# Patient Record
Sex: Female | Born: 1955 | Race: White | Hispanic: No | Marital: Married | Smoking: Never smoker
Health system: Southern US, Community
[De-identification: ages and names within clinical notes are randomized; demographics above are authoritative.]

## PROBLEM LIST (undated history)

## (undated) DIAGNOSIS — T8859XA Other complications of anesthesia, initial encounter: Secondary | ICD-10-CM

## (undated) DIAGNOSIS — R519 Headache, unspecified: Secondary | ICD-10-CM

## (undated) DIAGNOSIS — J45909 Unspecified asthma, uncomplicated: Secondary | ICD-10-CM

## (undated) DIAGNOSIS — D649 Anemia, unspecified: Secondary | ICD-10-CM

## (undated) DIAGNOSIS — K635 Polyp of colon: Secondary | ICD-10-CM

## (undated) DIAGNOSIS — M169 Osteoarthritis of hip, unspecified: Secondary | ICD-10-CM

## (undated) DIAGNOSIS — K219 Gastro-esophageal reflux disease without esophagitis: Secondary | ICD-10-CM

## (undated) DIAGNOSIS — F32A Depression, unspecified: Secondary | ICD-10-CM

## (undated) DIAGNOSIS — M1990 Unspecified osteoarthritis, unspecified site: Secondary | ICD-10-CM

## (undated) DIAGNOSIS — E669 Obesity, unspecified: Secondary | ICD-10-CM

## (undated) HISTORY — PX: ESOPHAGOGASTRODUODENOSCOPY: SHX1529

## (undated) HISTORY — PX: COLONOSCOPY: SHX174

---

## 2008-11-20 HISTORY — PX: OTHER SURGICAL HISTORY: SHX169

## 2017-11-20 HISTORY — PX: LAPAROSCOPIC REPAIR AND REMOVAL OF GASTRIC BAND: SHX5919

## 2021-11-23 ENCOUNTER — Other Ambulatory Visit: Payer: Self-pay | Admitting: Sports Medicine

## 2021-11-23 DIAGNOSIS — M5137 Other intervertebral disc degeneration, lumbosacral region: Secondary | ICD-10-CM

## 2021-11-23 DIAGNOSIS — M79605 Pain in left leg: Secondary | ICD-10-CM

## 2021-11-23 DIAGNOSIS — M47816 Spondylosis without myelopathy or radiculopathy, lumbar region: Secondary | ICD-10-CM

## 2021-11-23 DIAGNOSIS — M79604 Pain in right leg: Secondary | ICD-10-CM

## 2021-11-23 DIAGNOSIS — M4316 Spondylolisthesis, lumbar region: Secondary | ICD-10-CM

## 2021-12-07 ENCOUNTER — Other Ambulatory Visit: Payer: Self-pay

## 2021-12-07 ENCOUNTER — Ambulatory Visit
Admission: RE | Admit: 2021-12-07 | Discharge: 2021-12-07 | Disposition: A | Discharge status: Home / Self Care | Payer: BC Managed Care – PPO | Source: Ambulatory Visit | Attending: Sports Medicine | Admitting: Sports Medicine

## 2021-12-07 DIAGNOSIS — M4316 Spondylolisthesis, lumbar region: Secondary | ICD-10-CM | POA: Insufficient documentation

## 2021-12-07 DIAGNOSIS — M79605 Pain in left leg: Secondary | ICD-10-CM | POA: Insufficient documentation

## 2021-12-07 DIAGNOSIS — M79604 Pain in right leg: Secondary | ICD-10-CM | POA: Diagnosis present

## 2021-12-07 DIAGNOSIS — M5137 Other intervertebral disc degeneration, lumbosacral region: Secondary | ICD-10-CM | POA: Insufficient documentation

## 2021-12-07 DIAGNOSIS — M47816 Spondylosis without myelopathy or radiculopathy, lumbar region: Secondary | ICD-10-CM | POA: Insufficient documentation

## 2022-01-30 ENCOUNTER — Other Ambulatory Visit: Payer: Self-pay | Admitting: Orthopedic Surgery

## 2022-02-09 ENCOUNTER — Other Ambulatory Visit: Payer: Self-pay

## 2022-02-09 ENCOUNTER — Encounter
Admission: RE | Admit: 2022-02-09 | Discharge: 2022-02-09 | Disposition: A | Discharge status: Home / Self Care | Payer: BC Managed Care – PPO | Source: Ambulatory Visit | Attending: Orthopedic Surgery | Admitting: Orthopedic Surgery

## 2022-02-09 VITALS — BP 124/72 | HR 67 | Resp 16 | Ht 65.0 in | Wt 203.0 lb

## 2022-02-09 DIAGNOSIS — Z01818 Encounter for other preprocedural examination: Secondary | ICD-10-CM | POA: Insufficient documentation

## 2022-02-09 HISTORY — DX: Depression, unspecified: F32.A

## 2022-02-09 HISTORY — DX: Unspecified asthma, uncomplicated: J45.909

## 2022-02-09 HISTORY — DX: Gastro-esophageal reflux disease without esophagitis: K21.9

## 2022-02-09 HISTORY — DX: Unspecified osteoarthritis, unspecified site: M19.90

## 2022-02-09 HISTORY — DX: Headache, unspecified: R51.9

## 2022-02-09 HISTORY — DX: Obesity, unspecified: E66.9

## 2022-02-09 HISTORY — DX: Polyp of colon: K63.5

## 2022-02-09 HISTORY — DX: Other complications of anesthesia, initial encounter: T88.59XA

## 2022-02-09 HISTORY — DX: Anemia, unspecified: D64.9

## 2022-02-09 LAB — CBC WITH DIFFERENTIAL/PLATELET
Abs Immature Granulocytes: 0.01 10*3/uL (ref 0.00–0.07)
Basophils Absolute: 0.1 10*3/uL (ref 0.0–0.1)
Basophils Relative: 1 %
Eosinophils Absolute: 0.5 10*3/uL (ref 0.0–0.5)
Eosinophils Relative: 9 %
HCT: 39.6 % (ref 36.0–46.0)
Hemoglobin: 12.1 g/dL (ref 12.0–15.0)
Immature Granulocytes: 0 %
Lymphocytes Relative: 35 %
Lymphs Abs: 2 10*3/uL (ref 0.7–4.0)
MCH: 25.1 pg — ABNORMAL LOW (ref 26.0–34.0)
MCHC: 30.6 g/dL (ref 30.0–36.0)
MCV: 82.2 fL (ref 80.0–100.0)
Monocytes Absolute: 0.4 10*3/uL (ref 0.1–1.0)
Monocytes Relative: 6 %
Neutro Abs: 2.7 10*3/uL (ref 1.7–7.7)
Neutrophils Relative %: 49 %
Platelets: 235 10*3/uL (ref 150–400)
RBC: 4.82 MIL/uL (ref 3.87–5.11)
RDW: 19.7 % — ABNORMAL HIGH (ref 11.5–15.5)
WBC: 5.6 10*3/uL (ref 4.0–10.5)
nRBC: 0 % (ref 0.0–0.2)

## 2022-02-09 LAB — COMPREHENSIVE METABOLIC PANEL
ALT: 14 U/L (ref 0–44)
AST: 19 U/L (ref 15–41)
Albumin: 4 g/dL (ref 3.5–5.0)
Alkaline Phosphatase: 66 U/L (ref 38–126)
Anion gap: 9 (ref 5–15)
BUN: 25 mg/dL — ABNORMAL HIGH (ref 8–23)
CO2: 28 mmol/L (ref 22–32)
Calcium: 9.3 mg/dL (ref 8.9–10.3)
Chloride: 105 mmol/L (ref 98–111)
Creatinine, Ser: 0.86 mg/dL (ref 0.44–1.00)
GFR, Estimated: >60 mL/min (ref >60)
Glucose, Bld: 83 mg/dL (ref 70–99)
Potassium: 4.5 mmol/L (ref 3.5–5.1)
Sodium: 142 mmol/L (ref 135–145)
Total Bilirubin: 0.6 mg/dL (ref 0.3–1.2)
Total Protein: 7.4 g/dL (ref 6.5–8.1)

## 2022-02-09 LAB — URINALYSIS, ROUTINE W REFLEX MICROSCOPIC
Bilirubin Urine: NEGATIVE
Glucose, UA: NEGATIVE mg/dL
Hgb urine dipstick: NEGATIVE
Ketones, ur: NEGATIVE mg/dL
Leukocytes,Ua: NEGATIVE
Nitrite: NEGATIVE
Protein, ur: NEGATIVE mg/dL
Specific Gravity, Urine: 1.026 (ref 1.005–1.030)
pH: 5 (ref 5.0–8.0)

## 2022-02-09 LAB — TYPE AND SCREEN
ABO/RH(D): O NEG
Antibody Screen: NEGATIVE

## 2022-02-09 LAB — SURGICAL PCR SCREEN
MRSA, PCR: NEGATIVE
Staphylococcus aureus: NEGATIVE

## 2022-02-09 NOTE — Patient Instructions (Addendum)
Your procedure is scheduled on:02-21-22 Tuesday ?Report to the Registration Desk on the 1st floor of the [facility].Then proceed to the 2nd floor Surgery Desk in the [facility] ?To find out your arrival time, please call [phone] between 1PM - 3PM on:02-20-22 Monday ? ?REMEMBER: ?Instructions that are not followed completely may result in serious medical risk, up to and including death; or upon the discretion of your surgeon and anesthesiologist your surgery may need to be rescheduled. ? ?Do not eat food after midnight the night before surgery.  ?No gum chewing, lozengers or hard candies. ? ?You may however, drink CLEAR liquids up to 2 hours before you are scheduled to arrive for your surgery. Do not drink anything within 2 hours of your scheduled arrival time. ? ?Clear liquids include: ?- water  ?- apple juice without pulp ?- gatorade (not RED colors) ?- black coffee or tea (Do NOT add milk or creamers to the coffee or tea) ?Do NOT drink anything that is not on this list. ? ?In addition, your doctor has ordered for you to drink the provided  ?Ensure Pre-Surgery Clear Carbohydrate Drink  ?Drinking this carbohydrate drink up to two hours before surgery helps to reduce insulin resistance and improve patient outcomes. Please complete drinking 2 hours prior to scheduled arrival time. ? ?TAKE THESE MEDICATIONS THE MORNING OF SURGERY WITH A SIP OF WATER: ?-DULoxetine (CYMBALTA)  ?-cetirizine (ZYRTEC) ?-gabapentin (NEURONTIN) ?-omeprazole (PRILOSEC) -take one the night before and one on the morning of surgery - helps to prevent nausea after surgery.) ? ?Use your albuterol (VENTOLIN HFA) Inhaler the day of surgery and bring inhaler to the hospital ? ?One week prior to surgery:Last dose on 02-14-22 Tuesday ?Stop Anti-inflammatories (NSAIDS) such as Advil, Aleve, Ibuprofen, Motrin, Naproxen, Naprosyn and Aspirin based products such as Excedrin, Goodys Powder, BC Powder.You may however, take Tylenol if needed for pain up  until the day of surgery. ? ?Stop ANY OVER THE COUNTER supplements/vitamins 7 days prior to surgery (VITAMIN D3 , MAGNESIUM,VITAMIN B-12 ) ? ?No Alcohol for 24 hours before or after surgery. ? ?No Smoking including e-cigarettes for 24 hours prior to surgery.  ?No chewable tobacco products for at least 6 hours prior to surgery.  ?No nicotine patches on the day of surgery. ? ?Do not use any "recreational" drugs for at least a week prior to your surgery.  ?Please be advised that the combination of cocaine and anesthesia may have negative outcomes, up to and including death. ?If you test positive for cocaine, your surgery will be cancelled. ? ?On the morning of surgery brush your teeth with toothpaste and water, you may rinse your mouth with mouthwash if you wish. ?Do not swallow any toothpaste or mouthwash. ? ?Use CHG Soap as directed on instruction sheet. ? ?Do not wear jewelry, make-up, hairpins, clips or nail polish. ? ?Do not wear lotions, powders, or perfumes.  ? ?Do not shave body from the neck down 48 hours prior to surgery just in case you cut yourself which could leave a site for infection.  ?Also, freshly shaved skin may become irritated if using the CHG soap. ? ?Contact lenses, hearing aids and dentures may not be worn into surgery. ? ?Do not bring valuables to the hospital. [facility] is not responsible for any missing/lost belongings or valuables.  ? ?Notify your doctor if there is any change in your medical condition (cold, fever, infection). ? ?Wear comfortable clothing (specific to your surgery type) to the hospital. ? ?After surgery,  you can help prevent lung complications by doing breathing exercises.  ?Take deep breaths and cough every 1-2 hours. Your doctor may order a device called an Incentive Spirometer to help you take deep breaths. ?When coughing or sneezing, hold a pillow firmly against your incision with both hands. This is called ?splinting.? Doing this helps protect your incision. It also  decreases belly discomfort. ? ?If you are being admitted to the hospital overnight, leave your suitcase in the car. ?After surgery it may be brought to your room. ? ?If you are being discharged the day of surgery, you will not be allowed to drive home. ?You will need a responsible adult (18 years or older) to drive you home and stay with you that night.  ? ?If you are taking public transportation, you will need to have a responsible adult (18 years or older) with you. ?Please confirm with your physician that it is acceptable to use public transportation.  ? ?Please call the [facility] Dept. at [phone] if you have any questions about these instructions. ? ?Surgery Visitation Policy: ? ?Patients undergoing a surgery or procedure may have two family members or support persons with them as long as the person is not COVID-19 positive or experiencing its symptoms.  ? ?Inpatient Visitation:   ? ?Visiting hours are 7 a.m. to 8 p.m. ?Up to four visitors are allowed at one time in a patient room, including children. The visitors may rotate out with other people during the day. One designated support person (adult) may remain overnight. ? ?All Areas: ?All visitors must pass COVID-19 screenings, use hand sanitizer when entering and exiting the patient?s room and wear a mask at all times, including in the patient?s room. ?Patients must also wear a mask when staff or their visitor are in the room. ?Masking is required regardless of vaccination status.  ?

## 2022-02-17 ENCOUNTER — Other Ambulatory Visit: Payer: BC Managed Care – PPO

## 2022-02-21 ENCOUNTER — Observation Stay
Admission: RE | Admit: 2022-02-21 | Discharge: 2022-02-22 | Disposition: A | Discharge status: Home Health | Payer: BC Managed Care – PPO | Attending: Orthopedic Surgery | Admitting: Orthopedic Surgery

## 2022-02-21 ENCOUNTER — Encounter: Payer: Self-pay | Admitting: Orthopedic Surgery

## 2022-02-21 ENCOUNTER — Other Ambulatory Visit: Payer: Self-pay

## 2022-02-21 ENCOUNTER — Ambulatory Visit: Payer: BC Managed Care – PPO

## 2022-02-21 ENCOUNTER — Encounter
Admission: RE | Disposition: A | Discharge status: Home Health | Payer: Self-pay | Source: Home / Self Care | Attending: Orthopedic Surgery

## 2022-02-21 ENCOUNTER — Observation Stay: Payer: BC Managed Care – PPO

## 2022-02-21 ENCOUNTER — Ambulatory Visit: Payer: BC Managed Care – PPO | Admitting: Anesthesiology

## 2022-02-21 ENCOUNTER — Ambulatory Visit: Payer: BC Managed Care – PPO | Admitting: Urgent Care

## 2022-02-21 DIAGNOSIS — J45909 Unspecified asthma, uncomplicated: Secondary | ICD-10-CM | POA: Insufficient documentation

## 2022-02-21 DIAGNOSIS — Z79899 Other long term (current) drug therapy: Secondary | ICD-10-CM | POA: Insufficient documentation

## 2022-02-21 DIAGNOSIS — Z96641 Presence of right artificial hip joint: Secondary | ICD-10-CM

## 2022-02-21 DIAGNOSIS — M1611 Unilateral primary osteoarthritis, right hip: Secondary | ICD-10-CM | POA: Diagnosis not present

## 2022-02-21 HISTORY — PX: TOTAL HIP ARTHROPLASTY: SHX124

## 2022-02-21 LAB — CBC
HCT: 39.9 % (ref 36.0–46.0)
Hemoglobin: 12.6 g/dL (ref 12.0–15.0)
MCH: 26.4 pg (ref 26.0–34.0)
MCHC: 31.6 g/dL (ref 30.0–36.0)
MCV: 83.5 fL (ref 80.0–100.0)
Platelets: 219 10*3/uL (ref 150–400)
RBC: 4.78 MIL/uL (ref 3.87–5.11)
RDW: 18.8 % — ABNORMAL HIGH (ref 11.5–15.5)
WBC: 9.1 10*3/uL (ref 4.0–10.5)
nRBC: 0 % (ref 0.0–0.2)

## 2022-02-21 LAB — CREATININE, SERUM
Creatinine, Ser: 0.89 mg/dL (ref 0.44–1.00)
GFR, Estimated: >60 mL/min (ref >60)

## 2022-02-21 LAB — ABO/RH: ABO/RH(D): O NEG

## 2022-02-21 SURGERY — ARTHROPLASTY, HIP, TOTAL, ANTERIOR APPROACH
Anesthesia: General | Site: Hip | Laterality: Right

## 2022-02-21 MED ORDER — ALBUTEROL SULFATE (2.5 MG/3ML) 0.083% IN NEBU: 3.0000 mL | INHALATION_SOLUTION | Freq: Four times a day (QID) | RESPIRATORY_TRACT | Status: DC | PRN

## 2022-02-21 MED ORDER — SODIUM CHLORIDE FLUSH 0.9 % IV SOLN
INTRAVENOUS | Status: AC
  Filled 2022-02-21: qty 40

## 2022-02-21 MED ORDER — ENOXAPARIN SODIUM 40 MG/0.4ML IJ SOSY
40.0000 mg | PREFILLED_SYRINGE | INTRAMUSCULAR | Status: DC
  Administered 2022-02-22: 40 mg via SUBCUTANEOUS
  Filled 2022-02-21: qty 0.4

## 2022-02-21 MED ORDER — ORAL CARE MOUTH RINSE: 15.0000 mL | Freq: Once | OROMUCOSAL | Status: AC

## 2022-02-21 MED ORDER — MORPHINE SULFATE (PF) 2 MG/ML IV SOLN
0.5000 mg | INTRAVENOUS | Status: DC | PRN
  Administered 2022-02-21 – 2022-02-22 (×2): 1 mg via INTRAVENOUS
  Filled 2022-02-21 (×2): qty 1

## 2022-02-21 MED ORDER — OXYCODONE HCL 5 MG PO TABS: 5.0000 mg | ORAL_TABLET | Freq: Once | ORAL | Status: DC | PRN

## 2022-02-21 MED ORDER — TRANEXAMIC ACID-NACL 1000-0.7 MG/100ML-% IV SOLN
INTRAVENOUS | Status: DC | PRN
  Administered 2022-02-21: 1000 mg via INTRAVENOUS

## 2022-02-21 MED ORDER — CEFAZOLIN SODIUM-DEXTROSE 2-4 GM/100ML-% IV SOLN
2.0000 g | Freq: Four times a day (QID) | INTRAVENOUS | Status: AC
  Administered 2022-02-21 (×2): 2 g via INTRAVENOUS
  Filled 2022-02-21 (×2): qty 100

## 2022-02-21 MED ORDER — MIDAZOLAM HCL 5 MG/5ML IJ SOLN
INTRAMUSCULAR | Status: DC | PRN
Start: 2022-02-21 — End: 2022-02-21
  Administered 2022-02-21 (×2): 1 mg via INTRAVENOUS

## 2022-02-21 MED ORDER — CEFAZOLIN SODIUM-DEXTROSE 2-4 GM/100ML-% IV SOLN
2.0000 g | INTRAVENOUS | Status: AC
  Administered 2022-02-21: 2 g via INTRAVENOUS

## 2022-02-21 MED ORDER — MENTHOL 3 MG MT LOZG: 1.0000 | LOZENGE | OROMUCOSAL | Status: DC | PRN

## 2022-02-21 MED ORDER — GABAPENTIN 300 MG PO CAPS
300.0000 mg | ORAL_CAPSULE | Freq: Three times a day (TID) | ORAL | Status: DC
  Administered 2022-02-21: 300 mg via ORAL
  Filled 2022-02-21 (×2): qty 1

## 2022-02-21 MED ORDER — ACETAMINOPHEN 325 MG PO TABS: 325.0000 mg | ORAL_TABLET | Freq: Four times a day (QID) | ORAL | Status: DC | PRN

## 2022-02-21 MED ORDER — DULOXETINE HCL 30 MG PO CPEP
60.0000 mg | ORAL_CAPSULE | Freq: Every morning | ORAL | Status: DC
  Administered 2022-02-22: 60 mg via ORAL
  Filled 2022-02-21: qty 2

## 2022-02-21 MED ORDER — NEOMYCIN-POLYMYXIN B GU 40-200000 IR SOLN
Status: AC
  Filled 2022-02-21: qty 4

## 2022-02-21 MED ORDER — SODIUM CHLORIDE (PF) 0.9 % IJ SOLN
INTRAMUSCULAR | Status: DC | PRN
  Administered 2022-02-21: 90 mL via INTRA_ARTICULAR

## 2022-02-21 MED ORDER — TRAMADOL HCL 50 MG PO TABS
50.0000 mg | ORAL_TABLET | Freq: Four times a day (QID) | ORAL | Status: DC
  Administered 2022-02-21 – 2022-02-22 (×3): 50 mg via ORAL
  Filled 2022-02-21 (×3): qty 1

## 2022-02-21 MED ORDER — METOCLOPRAMIDE HCL 10 MG PO TABS: 5.0000 mg | ORAL_TABLET | Freq: Three times a day (TID) | ORAL | Status: DC | PRN

## 2022-02-21 MED ORDER — FENTANYL CITRATE (PF) 100 MCG/2ML IJ SOLN: 25.0000 ug | INTRAMUSCULAR | Status: DC | PRN

## 2022-02-21 MED ORDER — PHENOL 1.4 % MT LIQD: 1.0000 | OROMUCOSAL | Status: DC | PRN

## 2022-02-21 MED ORDER — PROPOFOL 500 MG/50ML IV EMUL
INTRAVENOUS | Status: DC | PRN
  Administered 2022-02-21: 100 ug/kg/min via INTRAVENOUS
  Administered 2022-02-21: 50 mg via INTRAVENOUS
  Administered 2022-02-21: 75 ug/kg/min via INTRAVENOUS

## 2022-02-21 MED ORDER — BUPIVACAINE LIPOSOME 1.3 % IJ SUSP
INTRAMUSCULAR | Status: AC
  Filled 2022-02-21: qty 20

## 2022-02-21 MED ORDER — GABAPENTIN 300 MG PO CAPS
300.0000 mg | ORAL_CAPSULE | Freq: Three times a day (TID) | ORAL | Status: DC
  Administered 2022-02-21: 300 mg via ORAL
  Filled 2022-02-21: qty 1

## 2022-02-21 MED ORDER — PROPOFOL 10 MG/ML IV BOLUS: INTRAVENOUS | Status: DC | PRN

## 2022-02-21 MED ORDER — NEOMYCIN-POLYMYXIN B GU 40-200000 IR SOLN
Status: AC
  Filled 2022-02-21: qty 20

## 2022-02-21 MED ORDER — DOCUSATE SODIUM 100 MG PO CAPS
100.0000 mg | ORAL_CAPSULE | Freq: Two times a day (BID) | ORAL | Status: DC
  Administered 2022-02-21 – 2022-02-22 (×2): 100 mg via ORAL
  Filled 2022-02-21 (×2): qty 1

## 2022-02-21 MED ORDER — ONDANSETRON HCL 4 MG/2ML IJ SOLN: 4.0000 mg | Freq: Four times a day (QID) | INTRAMUSCULAR | Status: DC | PRN

## 2022-02-21 MED ORDER — POLYETHYLENE GLYCOL 3350 17 G PO PACK: 17.0000 g | PACK | Freq: Every day | ORAL | Status: DC | PRN

## 2022-02-21 MED ORDER — TRAMADOL HCL 50 MG PO TABS
ORAL_TABLET | ORAL | Status: AC
  Administered 2022-02-21: 50 mg via ORAL
  Filled 2022-02-21: qty 1

## 2022-02-21 MED ORDER — PANTOPRAZOLE SODIUM 40 MG PO TBEC
40.0000 mg | DELAYED_RELEASE_TABLET | Freq: Every day | ORAL | Status: DC
  Administered 2022-02-22: 40 mg via ORAL
  Filled 2022-02-21: qty 1

## 2022-02-21 MED ORDER — PROPOFOL 10 MG/ML IV BOLUS
INTRAVENOUS | Status: AC
  Filled 2022-02-21: qty 20

## 2022-02-21 MED ORDER — ACETAMINOPHEN 10 MG/ML IV SOLN: 1000.0000 mg | Freq: Once | INTRAVENOUS | Status: DC | PRN

## 2022-02-21 MED ORDER — CEFAZOLIN SODIUM-DEXTROSE 2-4 GM/100ML-% IV SOLN
INTRAVENOUS | Status: AC
  Filled 2022-02-21: qty 100

## 2022-02-21 MED ORDER — ONDANSETRON HCL 4 MG/2ML IJ SOLN
INTRAMUSCULAR | Status: AC
  Filled 2022-02-21: qty 2

## 2022-02-21 MED ORDER — FENTANYL CITRATE (PF) 100 MCG/2ML IJ SOLN
INTRAMUSCULAR | Status: AC
  Filled 2022-02-21: qty 2

## 2022-02-21 MED ORDER — BISACODYL 5 MG PO TBEC
5.0000 mg | DELAYED_RELEASE_TABLET | Freq: Every day | ORAL | Status: DC | PRN
Start: 2022-02-21 — End: 2022-02-22

## 2022-02-21 MED ORDER — PHENYLEPHRINE HCL-NACL 20-0.9 MG/250ML-% IV SOLN
INTRAVENOUS | Status: DC | PRN
  Administered 2022-02-21: 20 ug/min via INTRAVENOUS
  Administered 2022-02-21: 40 ug/min via INTRAVENOUS

## 2022-02-21 MED ORDER — METHOCARBAMOL 1000 MG/10ML IJ SOLN
500.0000 mg | Freq: Four times a day (QID) | INTRAVENOUS | Status: DC | PRN
  Filled 2022-02-21: qty 5

## 2022-02-21 MED ORDER — ZOLPIDEM TARTRATE 5 MG PO TABS: 5.0000 mg | ORAL_TABLET | Freq: Every evening | ORAL | Status: DC | PRN

## 2022-02-21 MED ORDER — CHLORHEXIDINE GLUCONATE 0.12 % MT SOLN
OROMUCOSAL | Status: AC
  Administered 2022-02-21: 15 mL via OROMUCOSAL
  Filled 2022-02-21: qty 15

## 2022-02-21 MED ORDER — METHOCARBAMOL 500 MG PO TABS
ORAL_TABLET | ORAL | Status: AC
  Administered 2022-02-21: 500 mg via ORAL
  Filled 2022-02-21: qty 1

## 2022-02-21 MED ORDER — 0.9 % SODIUM CHLORIDE (POUR BTL) OPTIME
TOPICAL | Status: DC | PRN
Start: 2022-02-21 — End: 2022-02-21
  Administered 2022-02-21: 1000 mL

## 2022-02-21 MED ORDER — METHOCARBAMOL 500 MG PO TABS
500.0000 mg | ORAL_TABLET | Freq: Four times a day (QID) | ORAL | Status: DC | PRN
  Administered 2022-02-22: 500 mg via ORAL
  Filled 2022-02-21: qty 1

## 2022-02-21 MED ORDER — HYDROCODONE-ACETAMINOPHEN 5-325 MG PO TABS
1.0000 | ORAL_TABLET | ORAL | Status: DC | PRN
  Filled 2022-02-21: qty 1

## 2022-02-21 MED ORDER — HYDROCODONE-ACETAMINOPHEN 7.5-325 MG PO TABS
1.0000 | ORAL_TABLET | ORAL | Status: DC | PRN
  Administered 2022-02-21: 1 via ORAL
  Filled 2022-02-21: qty 1

## 2022-02-21 MED ORDER — ONDANSETRON HCL 4 MG PO TABS: 4.0000 mg | ORAL_TABLET | Freq: Four times a day (QID) | ORAL | Status: DC | PRN

## 2022-02-21 MED ORDER — MIDAZOLAM HCL 2 MG/2ML IJ SOLN
INTRAMUSCULAR | Status: AC
  Filled 2022-02-21: qty 2

## 2022-02-21 MED ORDER — MAGNESIUM HYDROXIDE 400 MG/5ML PO SUSP
30.0000 mL | Freq: Every day | ORAL | Status: DC
  Administered 2022-02-21: 30 mL via ORAL
  Filled 2022-02-21: qty 30

## 2022-02-21 MED ORDER — BUPIVACAINE-EPINEPHRINE (PF) 0.25% -1:200000 IJ SOLN
INTRAMUSCULAR | Status: AC
  Filled 2022-02-21: qty 30

## 2022-02-21 MED ORDER — FENTANYL CITRATE (PF) 100 MCG/2ML IJ SOLN
INTRAMUSCULAR | Status: DC | PRN
  Administered 2022-02-21: 50 ug via INTRAVENOUS

## 2022-02-21 MED ORDER — ONDANSETRON HCL 4 MG/2ML IJ SOLN
INTRAMUSCULAR | Status: DC | PRN
  Administered 2022-02-21: 4 mg via INTRAVENOUS

## 2022-02-21 MED ORDER — BUPIVACAINE HCL (PF) 0.5 % IJ SOLN
INTRAMUSCULAR | Status: DC | PRN
  Administered 2022-02-21: 2.3 mL

## 2022-02-21 MED ORDER — MORPHINE SULFATE (PF) 10 MG/ML IV SOLN
INTRAVENOUS | Status: AC
  Filled 2022-02-21: qty 1

## 2022-02-21 MED ORDER — LACTATED RINGERS IV SOLN: INTRAVENOUS | Status: DC

## 2022-02-21 MED ORDER — DIPHENHYDRAMINE HCL 12.5 MG/5ML PO ELIX: 12.5000 mg | ORAL_SOLUTION | ORAL | Status: DC | PRN

## 2022-02-21 MED ORDER — TRANEXAMIC ACID 1000 MG/10ML IV SOLN
INTRAVENOUS | Status: AC
  Filled 2022-02-21: qty 10

## 2022-02-21 MED ORDER — DEXAMETHASONE SODIUM PHOSPHATE 10 MG/ML IJ SOLN
INTRAMUSCULAR | Status: DC | PRN
  Administered 2022-02-21: 10 mg via INTRAVENOUS

## 2022-02-21 MED ORDER — SODIUM CHLORIDE 0.9 % IV SOLN: INTRAVENOUS | Status: DC

## 2022-02-21 MED ORDER — OXYCODONE HCL 5 MG/5ML PO SOLN: 5.0000 mg | Freq: Once | ORAL | Status: DC | PRN

## 2022-02-21 MED ORDER — METOCLOPRAMIDE HCL 5 MG/ML IJ SOLN: 5.0000 mg | Freq: Three times a day (TID) | INTRAMUSCULAR | Status: DC | PRN

## 2022-02-21 MED ORDER — FLEET ENEMA 7-19 GM/118ML RE ENEM: 1.0000 | ENEMA | Freq: Once | RECTAL | Status: DC | PRN

## 2022-02-21 MED ORDER — CHLORHEXIDINE GLUCONATE 0.12 % MT SOLN: 15.0000 mL | Freq: Once | OROMUCOSAL | Status: AC

## 2022-02-21 MED ORDER — EPHEDRINE SULFATE (PRESSORS) 50 MG/ML IJ SOLN
INTRAMUSCULAR | Status: DC | PRN
  Administered 2022-02-21: 5 mg via INTRAVENOUS

## 2022-02-21 MED ORDER — LORATADINE 10 MG PO TABS
10.0000 mg | ORAL_TABLET | Freq: Every day | ORAL | Status: DC
  Filled 2022-02-21: qty 1

## 2022-02-21 MED ORDER — ONDANSETRON HCL 4 MG/2ML IJ SOLN: 4.0000 mg | Freq: Once | INTRAMUSCULAR | Status: DC | PRN

## 2022-02-21 MED ORDER — DEXAMETHASONE SODIUM PHOSPHATE 10 MG/ML IJ SOLN
INTRAMUSCULAR | Status: AC
  Filled 2022-02-21: qty 1

## 2022-02-21 MED ORDER — ALUM & MAG HYDROXIDE-SIMETH 200-200-20 MG/5ML PO SUSP: 30.0000 mL | ORAL | Status: DC | PRN

## 2022-02-21 MED ORDER — PHENYLEPHRINE 40 MCG/ML (10ML) SYRINGE FOR IV PUSH (FOR BLOOD PRESSURE SUPPORT)
PREFILLED_SYRINGE | INTRAVENOUS | Status: DC | PRN
  Administered 2022-02-21 (×2): 80 ug via INTRAVENOUS

## 2022-02-21 MED ORDER — NEOMYCIN-POLYMYXIN B GU 40-200000 IR SOLN
Status: DC | PRN
Start: 2022-02-21 — End: 2022-02-21
  Administered 2022-02-21: 4 mL

## 2022-02-21 MED ORDER — ACETAMINOPHEN 325 MG PO TABS
ORAL_TABLET | ORAL | Status: AC
  Administered 2022-02-21: 325 mg via ORAL
  Filled 2022-02-21: qty 1

## 2022-02-21 SURGICAL SUPPLY — 55 items
BLADE SAGITTAL AGGR TOOTH XLG (BLADE) ×2 IMPLANT
BNDG COHESIVE 6X5 TAN ST LF (GAUZE/BANDAGES/DRESSINGS) ×6 IMPLANT
CANISTER WOUND CARE 500ML ATS (WOUND CARE) ×2 IMPLANT
CHLORAPREP W/TINT 26 (MISCELLANEOUS) ×2 IMPLANT
COVER BACK TABLE REUSABLE LG (DRAPES) ×2 IMPLANT
DRAPE 3/4 80X56 (DRAPES) ×6 IMPLANT
DRAPE C-ARM XRAY 36X54 (DRAPES) ×2 IMPLANT
DRAPE INCISE IOBAN 66X60 STRL (DRAPES) IMPLANT
DRAPE POUCH INSTRU U-SHP 10X18 (DRAPES) ×2 IMPLANT
DRESSING SURGICEL FIBRLLR 1X2 (HEMOSTASIS) ×2 IMPLANT
DRSG MEPILEX SACRM 8.7X9.8 (GAUZE/BANDAGES/DRESSINGS) ×2 IMPLANT
DRSG SURGICEL FIBRILLAR 1X2 (HEMOSTASIS) ×4
ELECT BLADE 6.5 EXT (BLADE) ×2 IMPLANT
ELECT REM PT RETURN 9FT ADLT (ELECTROSURGICAL) ×2
ELECTRODE REM PT RTRN 9FT ADLT (ELECTROSURGICAL) ×1 IMPLANT
GLOVE SURG SYN 9.0  PF PI (GLOVE) ×2
GLOVE SURG SYN 9.0 PF PI (GLOVE) ×2 IMPLANT
GLOVE SURG UNDER POLY LF SZ9 (GLOVE) ×2 IMPLANT
GOWN SRG 2XL LVL 4 RGLN SLV (GOWNS) ×1 IMPLANT
GOWN STRL NON-REIN 2XL LVL4 (GOWNS) ×1
GOWN STRL REUS W/ TWL LRG LVL3 (GOWN DISPOSABLE) ×1 IMPLANT
GOWN STRL REUS W/TWL LRG LVL3 (GOWN DISPOSABLE) ×1
HEAD FEMORAL 28MM SZ S (Head) ×1 IMPLANT
HOLDER FOLEY CATH W/STRAP (MISCELLANEOUS) ×2 IMPLANT
IV NS 250ML (IV SOLUTION) ×1
IV NS 250ML BAXH (IV SOLUTION) IMPLANT
KIT PREVENA INCISION MGT 13 (CANNISTER) ×2 IMPLANT
LINER DBL MOB SZ 0 52MM (Liner) ×1 IMPLANT
MANIFOLD NEPTUNE II (INSTRUMENTS) ×2 IMPLANT
MASTERLOC HIP LATERAL S6 (Hips) ×1 IMPLANT
MAT ABSORB  FLUID 56X50 GRAY (MISCELLANEOUS) ×1
MAT ABSORB FLUID 56X50 GRAY (MISCELLANEOUS) ×1 IMPLANT
NDL SPNL 20GX3.5 QUINCKE YW (NEEDLE) ×2 IMPLANT
NEEDLE SPNL 20GX3.5 QUINCKE YW (NEEDLE) ×4 IMPLANT
NS IRRIG 1000ML POUR BTL (IV SOLUTION) ×2 IMPLANT
PACK HIP COMPR (MISCELLANEOUS) ×2 IMPLANT
SCALPEL PROTECTED #10 DISP (BLADE) ×4 IMPLANT
SHELL ACETABULAR SZ 52 DM (Shell) ×1 IMPLANT
STAPLER SKIN PROX 35W (STAPLE) ×2 IMPLANT
STRAP SAFETY 5IN WIDE (MISCELLANEOUS) ×2 IMPLANT
SUT DVC 2 QUILL PDO  T11 36X36 (SUTURE)
SUT DVC 2 QUILL PDO T11 36X36 (SUTURE) ×1 IMPLANT
SUT SILK 0 (SUTURE) ×1
SUT SILK 0 30XBRD TIE 6 (SUTURE) ×1 IMPLANT
SUT V-LOC 90 ABS DVC 3-0 CL (SUTURE) ×2 IMPLANT
SUT VIC AB 1 CT1 36 (SUTURE) ×2 IMPLANT
SUT VLOC 90 6 CV-15 VIOLET (SUTURE) ×1 IMPLANT
SYR 20ML LL LF (SYRINGE) ×2 IMPLANT
SYR 30ML LL (SYRINGE) ×2 IMPLANT
SYR 50ML LL SCALE MARK (SYRINGE) ×4 IMPLANT
SYR BULB IRRIG 60ML STRL (SYRINGE) ×2 IMPLANT
TAPE MICROFOAM 4IN (TAPE) ×2 IMPLANT
TOWEL OR 17X26 4PK STRL BLUE (TOWEL DISPOSABLE) IMPLANT
TRAY FOLEY MTR SLVR 16FR STAT (SET/KITS/TRAYS/PACK) ×2 IMPLANT
WAND WEREWOLF FASTSEAL 6.0 (MISCELLANEOUS) ×1 IMPLANT

## 2022-02-21 NOTE — H&P (Signed)
?Chief Complaint  ?Patient presents with  ? Right Hip - Follow-up  ?H&P- RT THA- 02/21/22 by Dr. Rosita Kea  ? ?Reason for Visit ?Lindsey Woods is a 66 y.o. who presents today for history and physical. She is to undergo a right total hip arthroplasty on 02/21/2022. Last seen in the clinic on 01/18/2022. No change in her condition since that time. ? ?Patient began having discomfort to both knees several months ago is along with leg pain. The patient has been previously seen by Dorthula Nettles, DO. She had right hip injection on 11/22/2021. She also has some lumbar stenosis and may be getting epidural steroid in the future. She had x-rays lumbar spine and hip on 11/22/2021. She is also seeing Dr. Ether Griffins for a foot condition. Regarding her hip x-ray, there is significant bilateral hip osteoarthritis with significant loss of joint space, left maybe worse than right superiorly, but right hip has more central arthritis with complete loss of joint space centrally and osteophytes on the femoral head and neck. The patient feels the injection by Dr. Landry Mellow helped for a little while. She had seen Dr. Mariah Milling one time to discuss injections in the back. She is scheduled for epidural injection on February 09, 2022. 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 The patient reports she has always had back pain and had some falls earlier in life. She is obese and had to have bariatric surgery and lose 100 pounds. The patient reports pain affects her activities of daily living. She notes her back is stiff after prolonged standing. She reports pain radiates down to her knee. She has history of restless legs and tried Epsom bath salt, magnesium, and stretches, but this stopped after her hip pain began. She denies any personal or family history of blood clots. ? ?Patient states the pain is increased to the point is significant interfering with her activities of daily living and wishes to proceed with surgery. She is lost 30  pounds following her surgery. ? ?Past Medical History ?Past Medical History:  ?Diagnosis Date  ? Allergic state 1981  ?cats, manageable, pollens increasingly problematic with age  ? Asthma without status asthmaticus, unspecified 1981  ?see above, also obesity made worse, gone since 2010  ? Colon polyps  ? Depression  ? GERD (gastroesophageal reflux disease)  ? ?Past Surgical History ?Past Surgical History:  ?Procedure Laterality Date  ? CESAREAN SECTION  ? COLONOSCOPY  ? LAPAROSCOPIC GASTROPLASTY NON-VERTICAL BANDING FOR OBESITY  ? ?Past Family History ?Family History  ?Problem Relation Age of Onset  ? Parkinsonism Mother  ? Obesity Mother  ?deceased 1992/deceased 20  ? Stroke Mother  ? Other Mother  ?Parkinson's/Parkinson's Disease  ? Lung cancer Father  ? Coronary Artery Disease (Blocked arteries around heart) Father  ? Obesity Father  ?deceased 1984/Deceased 44  ? Colon cancer Maternal Grandmother  ? Obesity Brother  ?deceased 51  ? Skin cancer Brother  ?several squamous and basal cells removed  ? Skin cancer Brother  ?several squamous cell lesions removed  ? Depression Sister  ? Skin cancer Sister  ? Breast cancer Neg Hx  ? Diabetes type II Neg Hx  ? Hyperlipidemia (Elevated cholesterol) Neg Hx  ? Thyroid disease Neg Hx  ? Colon polyps Neg Hx  ? ?Medications ?Current Outpatient Medications Ordered in Epic  ?Medication Sig Dispense Refill  ? acetaminophen (TYLENOL) 325 MG tablet Take 650 mg by mouth in the morning, at noon, and at bedtime.  ? albuterol 90  mcg/actuation inhaler Inhale 2 inhalations into the lungs every 6 (six) hours as needed for Wheezing 16 g 2  ? cetirizine (ZYRTEC) 10 MG tablet Take 10 mg by mouth once daily  ? cholecalciferol (VITAMIN D3) 1000 unit tablet Take by mouth  ? cyanocobalamin (VITAMIN B12) 1000 MCG tablet Take 1,000 mcg by mouth once daily  ? diphenhydrAMINE (BENADRYL) 25 mg capsule Take by mouth  ? DULoxetine (CYMBALTA) 60 MG DR capsule Take 1 capsule (60 mg total) by mouth  once daily 90 capsule 3  ? EPINEPHrine (EPIPEN) 0.3 mg/0.3 mL auto-injector  ? gabapentin (NEURONTIN) 300 MG capsule Take 1-2 capsules (300-600 mg total) by mouth 3 (three) times daily as needed 180 capsule 1  ? ibuprofen (MOTRIN) 200 MG tablet Take 400 mg by mouth as needed for Pain  ? omeprazole (PRILOSEC) 20 MG DR capsule TAKE ONE CAPSULE BY MOUTH TWICE A DAY BEFORE MEALS 180 capsule 2  ? ?No current Epic-ordered facility-administered medications on file.  ? ?Allergies ?No Known Allergies  ? ?Review of Systems ?A comprehensive 14 point ROS was performed, reviewed, and the pertinent orthopaedic findings are documented in the HPI. ? ?Exam ?BP 128/82 (BP Location: Right upper arm, Patient Position: Sitting, BP Cuff Size: Large Adult)  Ht 160 cm (5\' 3" )  Wt 91.7 kg (202 lb 3.2 oz)  BMI 35.82 kg/m?  ? ?General: ?Well-developed well-nourished female seen in no acute distress. She is however noted to ambulate with a slight antalgic gait ? ?HEENT: ?Atraumatic,normocephalic. Pupils are equal and reactive to light. Oropharynx is clear with moist mucosa ? ?Lungs: ?Clear to auscultation bilaterally  ? ?Cardiovascular: ?Regular rate and rhythm. Normal S1, S2. No murmurs. No appreciable gallops or rubs. Peripheral pulses are palpable. ? ?Abdomen: ?Soft, non-tender, nondistended. Bowel sounds present ? ?Extremity: ?Patient is noted to have limited range of motion. 10 degrees of external rotation with approximate 30 degrees of internal rotation noted. Abduction approximately 20 degrees. ? ?Neurological: ? ?The patient is alert and oriented ?Sensation to light touch appears to be intact and within normal limits ?Gross motor strength appeared to be equal to 5/5 ? ?Vascular : ? ?Peripheral pulses felt to be palpable. ?Capillary refill appears to be intact and within normal limits ? ?X-ray ? ?X-rays taken on 01/18/2022 shows severe degenerative process to both hips with the left being greater than the right. Is noted to have  central arthritis to the right hip. ? ?Impression ? ?1. Degenerative arthrosis right hip ? ?Plan  ? ?1. Patient is to discontinue her anti-inflammatory medication 1 week prior to surgery ?2. Discussed postop rehab course ?3. Patient is planning on going home following surgery ? ?This note was generated in part with voice recognition software and I apologize for any typographical errors that were not detected and corrected ? ? ?03/20/2022 PA ?Electronically signed by Tera Partridge, PA at 02/07/2022 1:46 PM EDT ? ?Reviewed  H+P. ?No changes noted. ? ?

## 2022-02-21 NOTE — Anesthesia Procedure Notes (Signed)
Spinal ? ?Patient location during procedure: OR ?Start time: 02/21/2022 10:37 AM ?Reason for block: surgical anesthesia ?Staffing ?Performed: resident/CRNA  ?Preanesthetic Checklist ?Completed: patient identified, IV checked, site marked, risks and benefits discussed, surgical consent, monitors and equipment checked, pre-op evaluation and timeout performed ?Spinal Block ?Patient position: sitting ?Prep: DuraPrep ?Patient monitoring: heart rate, cardiac monitor, continuous pulse ox and blood pressure ?Approach: midline ?Location: L3-4 ?Injection technique: single-shot ?Needle ?Needle type: Sprotte  ?Needle gauge: 24 G ?Needle length: 9 cm ?Assessment ?Sensory level: T4 ?Events: CSF return ? ? ? ?

## 2022-02-21 NOTE — Transfer of Care (Addendum)
Immediate Anesthesia Transfer of Care Note ? ?Patient: Lindsey Woods ? ?Procedure(s) Performed: TOTAL HIP ARTHROPLASTY ANTERIOR APPROACH (Right: Hip) ? ?Patient Location: PACU ? ?Anesthesia Type:General and Spinal ? ?Level of Consciousness: awake, alert , oriented and patient cooperative ? ?Airway & Oxygen Therapy: Patient Spontanous Breathing and Patient connected to face mask oxygen ? ?Post-op Assessment: Report given to RN and Post -op Vital signs reviewed and stable ? ?Post vital signs: Reviewed and stable ? ?Last Vitals:  ?Vitals Value Taken Time  ?BP 103/57 02/21/22 1202  ?Temp 36.4 ?C 02/21/22 1202  ?Pulse 71 02/21/22 1206  ?Resp 14 02/21/22 1206  ?SpO2 100 % 02/21/22 1206  ?Vitals shown include unvalidated device data. ? ?Last Pain:  ?Vitals:  ? 02/21/22 1202  ?TempSrc:   ?PainSc: 0-No pain  ?   ? ?  ? ?Complications: No notable events documented. ?

## 2022-02-21 NOTE — Op Note (Signed)
02/21/2022 ? ?12:04 PM ? ?PATIENT:  Lindsey Woods  66 y.o. female ? ?PRE-OPERATIVE DIAGNOSIS:  PRIMARY OSTEOARTHRITIS OF RIGHT HIP. ? ?POST-OPERATIVE DIAGNOSIS:  PRIMARY OSTEOARTHRITIS OF RIGHT HIP. ? ?PROCEDURE:  Procedure(s): ?TOTAL HIP ARTHROPLASTY ANTERIOR APPROACH (Right) ? ?SURGEON: Leitha Schuller, MD ? ?ASSISTANTS: None ? ?ANESTHESIA:   spinal ? ?EBL:  Total I/O ?In: 110 [IV Piggyback:110] ?Out: 550 [Urine:400; Blood:150] ? ?BLOOD ADMINISTERED:none ? ?DRAINS:  Incisional wound VAC   ? ?LOCAL MEDICATIONS USED:  MARCAINE    and OTHER Exparel ? ?SPECIMEN: Right femoral head ? ?DISPOSITION OF SPECIMEN:  PATHOLOGY ? ?COUNTS:  YES ? ?TOURNIQUET:  * No tourniquets in log * ? ?IMPLANTS: Medacta Masterloc 6 LAT stem with ceramic S 28 mm head, 52 mm Mpact DM cup and liner ? ?DICTATION: .Dragon Dictation   The patient was brought to the operating room and after spinal anesthesia was obtained patient was placed on the operative table with the ipsilateral foot into the Medacta attachment, contralateral leg on a well-padded table. C-arm was brought in and preop template x-ray taken. After prepping and draping in usual sterile fashion appropriate patient identification and timeout procedures were completed. Anterior approach to the hip was obtained and centered over the greater trochanter and TFL muscle. The subcutaneous tissue was incised hemostasis being achieved by electrocautery. TFL fascia was incised and the muscle retracted laterally deep retractor placed. The lateral femoral circumflex vessels were identified and ligated. The anterior capsule was exposed and a capsulotomy performed. The neck was identified and a femoral neck cut carried out with a saw. The head was removed without difficulty and showed sclerotic femoral head and acetabulum. Reaming was carried out to 52 mm and a 52 mm cup trial gave appropriate tightness to the acetabular component a 52 DM cup was impacted into position. The leg was then externally  rotated and ischiofemoral and pubofemoral releases carried out. The femur was sequentially broached to a size 6, size 6 stem with lateralized head and S head trials were placed and the final components chosen. The 6 LAT stem was inserted along with a ceramic S 28 mm head and 52 mm liner. The hip was reduced and was stable the wound was thoroughly irrigated with fibrillar placed along the posterior capsule and medial neck. The deep fascia ws closed using a heavy Quill after infiltration of 30 cc of quarter percent Sensorcaine with epinephrine diluted with Exparel throughout the case .3-0 V-loc to close the skin with skin staples.  Incisional wound VAC applied and patient was sent to recovery in stable condition.  ? ?PLAN OF CARE: Admit for overnight observation ? ?

## 2022-02-21 NOTE — Anesthesia Postprocedure Evaluation (Signed)
Anesthesia Post Note ? ?Patient: Lindsey Woods ? ?Procedure(s) Performed: TOTAL HIP ARTHROPLASTY ANTERIOR APPROACH (Right: Hip) ? ?Patient location during evaluation: PACU ?Anesthesia Type: Spinal ?Level of consciousness: awake and alert, oriented and patient cooperative ?Pain management: pain level controlled ?Vital Signs Assessment: post-procedure vital signs reviewed and stable ?Respiratory status: spontaneous breathing, nonlabored ventilation and respiratory function stable ?Cardiovascular status: blood pressure returned to baseline and stable ?Postop Assessment: adequate PO intake and no headache ?Anesthetic complications: no ? ? ?No notable events documented. ? ? ?Last Vitals:  ?Vitals:  ? 02/21/22 1206 02/21/22 1215  ?BP:  105/68  ?Pulse: 71 65  ?Resp: 14 12  ?Temp:    ?SpO2: 100% 100%  ?  ?Last Pain:  ?Vitals:  ? 02/21/22 1202  ?TempSrc:   ?PainSc: 0-No pain  ? ? ?  ?  ?  ?  ?  ?  ? ?Reed Breech ? ? ? ? ?

## 2022-02-21 NOTE — Anesthesia Preprocedure Evaluation (Addendum)
Anesthesia Evaluation  ?Patient identified by MRN, date of birth, ID band ?Patient awake ? ? ? ?Reviewed: ?Allergy & Precautions, NPO status , Patient's Chart, lab work & pertinent test results ? ?History of Anesthesia Complications ?Negative for: history of anesthetic complications ? ?Airway ?Mallampati: I ? ? ?Neck ROM: Full ? ? ? Dental ? ?(+) Missing, Chipped ?  ?Pulmonary ?asthma ,  ?  ?Pulmonary exam normal ?breath sounds clear to auscultation ? ? ? ? ? ? Cardiovascular ?Exercise Tolerance: Good ?Normal cardiovascular exam ?Rhythm:Regular Rate:Normal ? ?ECG 02/09/22: normal ?  ?Neuro/Psych ? Headaches, PSYCHIATRIC DISORDERS Depression Chronic lower back pain ?  ? GI/Hepatic ?GERD  ,S/p gastric band and removal ?  ?Endo/Other  ?Obesity  ? Renal/GU ?negative Renal ROS  ? ?  ?Musculoskeletal ? ?(+) Arthritis ,  ? Abdominal ?  ?Peds ? Hematology ? ?(+) Blood dyscrasia, anemia ,   ?Anesthesia Other Findings ? ? Reproductive/Obstetrics ? ?  ? ? ? ? ? ? ? ? ? ? ? ? ? ?  ?  ? ? ? ? ? ? ? ?Anesthesia Physical ?Anesthesia Plan ? ?ASA: 2 ? ?Anesthesia Plan: General and Spinal  ? ?Post-op Pain Management:   ? ?Induction: Intravenous ? ?PONV Risk Score and Plan: 3 and Propofol infusion, TIVA, Treatment may vary due to age or medical condition and Ondansetron ? ?Airway Management Planned: Natural Airway and Nasal Cannula ? ?Additional Equipment:  ? ?Intra-op Plan:  ? ?Post-operative Plan:  ? ?Informed Consent: I have reviewed the patients History and Physical, chart, labs and discussed the procedure including the risks, benefits and alternatives for the proposed anesthesia with the patient or authorized representative who has indicated his/her understanding and acceptance.  ? ? ? ? ? ?Plan Discussed with: CRNA ? ?Anesthesia Plan Comments: (Plan for spinal and GA with natural airway, LMA/GETA backup.  Patient consented for risks of anesthesia including but not limited to:  ?- adverse  reactions to medications ?- damage to eyes, teeth, lips or other oral mucosa ?- nerve damage due to positioning  ?- sore throat or hoarseness ?- headache, bleeding, infection, nerve damage 2/2 spinal ?- damage to heart, brain, nerves, lungs, other parts of body or loss of life ? ?Informed patient about role of CRNA in peri- and intra-operative care.  Patient voiced understanding.)  ? ? ? ? ? ? ?Anesthesia Quick Evaluation ? ?

## 2022-02-21 NOTE — Evaluation (Signed)
Physical Therapy Evaluation ?Patient Details ?Name: Lindsey Woods ?MRN: [id number] ?DOB: September 27, 1956 ?Today's Date: 02/21/2022 ? ?History of Present Illness ? Patient is a 66 year old female s/p R anterior total hip replacement on 02/21/22  ?Clinical Impression ? Physical therapy evaluation completed this date. Patient tolerated session well and was agreeable to treatment. Patient reported only mild pain 2/10 pain throughout session, however noted lingering affects of anesthesia were present. Patient lives in a 1 story home with her husband and son with 3 STE and BHRs. Patient was Mod I/Independent with all ALDs and Mobility prior to hospitalization.  ? ?Patient demonstrated 2/5 strength in BLEs due to continues affects of anesthesia. Light touch bilaterally was mildly impaired. Due to lingering affects of the anesthesia, sit to stand transfers, ambulation, and steps were deferred until tomorrow. Patient required Min A for supine<>sit transfers with assistance and BLEs and bed rails. Patient completed 1-2 reps on LLE of multiple exercises in the HEP for demonstration. Recommend reviewing HEP again with patient prior to discharge. Patient would continue to benefit from skilled physical therapy in order to optimize patient's return to PLOF. Recommend HHPT upon discharge from acute hospitalization.  ?   ? ?Recommendations for follow up therapy are one component of a multi-disciplinary discharge planning process, led by the attending physician.  Recommendations may be updated based on patient status, additional functional criteria and insurance authorization. ? ?Follow Up Recommendations Home health PT ? ?  ?Assistance Recommended at Discharge Frequent or constant Supervision/Assistance  ?Patient can return home with the following ? A little help with walking and/or transfers;Help with stairs or ramp for entrance;A little help with bathing/dressing/bathroom ? ?  ?Equipment Recommendations Rolling walker (2 wheels)   ?Recommendations for Other Services ?    ?  ?Functional Status Assessment Patient has had a recent decline in their functional status and demonstrates the ability to make significant improvements in function in a reasonable and predictable amount of time.  ? ?  ?Precautions / Restrictions Precautions ?Precautions: Anterior Hip;Fall ?Restrictions ?Weight Bearing Restrictions: Yes ?RLE Weight Bearing: Weight bearing as tolerated  ? ?  ? ?Mobility ? Bed Mobility ?Overal bed mobility: Needs Assistance ?Bed Mobility: Supine to Sit, Sit to Supine ?  ?  ?Supine to sit: Min assist ?Sit to supine: Min assist ?  ?General bed mobility comments: use of bed rails and assistance at BLEs due to continued affects of anesthesia ?  ? ?Transfers ?Overall transfer level:  (deferred today due to continued affects of anesthesia) ?  ?  ?  ?  ?  ?  ?  ?  ?  ?  ? ?Ambulation/Gait ?Ambulation/Gait assistance:  (deferred today due to continued affects of anesthesia) ?  ?  ?  ?  ?  ?  ?  ? ?Stairs ?  ?  ?  ?  ?  ? ?Wheelchair Mobility ?  ? ?Modified Rankin (Stroke Patients Only) ?  ? ?  ? ?Balance Overall balance assessment: Needs assistance ?Sitting-balance support: Bilateral upper extremity supported, Feet supported ?Sitting balance-Leahy Scale: Fair ?  ?  ?  ?  ?Standing balance comment: not assessed today ?  ?  ?  ?  ?  ?  ?  ?  ?  ?  ?  ?   ? ? ? ?Pertinent Vitals/Pain Pain Assessment ?Pain Assessment: 0-10 ?Pain Score: 2  ?Pain Descriptors / Indicators: Aching, Discomfort, Dull ?Pain Intervention(s): Monitored during session, Repositioned  ? ? ?Home Living Family/patient expects to be discharged  to:: Private residence ?Living Arrangements: Spouse/significant other ?Available Help at Discharge: Family ?Type of Home: House ?Home Access: Stairs to enter ?Entrance Stairs-Rails: Right;Left ?Entrance Stairs-Number of Steps: 3 ?  ?Home Layout: One level ?Home Equipment: Grab bars - tub/shower ?Additional Comments: walking stick, grabber  ?   ?Prior Function Prior Level of Function : Independent/Modified Independent ?  ?  ?  ?  ?  ?  ?  ?  ?  ? ? ?Hand Dominance  ? Dominant Hand: Right ? ?  ?Extremity/Trunk Assessment  ? Upper Extremity Assessment ?Upper Extremity Assessment: Overall WFL for tasks assessed ?  ? ?Lower Extremity Assessment ?Lower Extremity Assessment: Generalized weakness;RLE deficits/detail;LLE deficits/detail ?RLE Deficits / Details: Decreased ROM and strength due to surgical site ?RLE Sensation: decreased light touch ?LLE Deficits / Details: limited ROM and strength due to continued affects of anesthesia ?LLE Sensation: decreased light touch ?  ? ?   ?Communication  ? Communication: No difficulties  ?Cognition Arousal/Alertness: Awake/alert ?Behavior During Therapy: [facility] for tasks assessed/performed ?Overall Cognitive Status: Within Functional Limits for tasks assessed ?  ?  ?  ?  ?  ?  ?  ?  ?  ?  ?  ?  ?  ?  ?  ?  ?General Comments: A&Ox3- self location situation ?  ?  ? ?  ?General Comments   ? ?  ?Exercises Other Exercises ?Other Exercises: Patient was educated on role of PT in acute setting, WBing precautions, HEP packet, d/c recommendations  ? ?Assessment/Plan  ?  ?PT Assessment Patient needs continued PT services  ?PT Problem List Decreased strength;Decreased mobility;Decreased range of motion;Decreased activity tolerance;Decreased balance;Pain;Decreased knowledge of precautions;Decreased knowledge of use of DME ? ?   ?  ?PT Treatment Interventions DME instruction;Therapeutic activities;Gait training;Therapeutic exercise;Stair training;Balance training;Patient/family education   ? ?PT Goals (Current goals can be found in the Care Plan section)  ?Acute Rehab PT Goals ?Patient Stated Goal: to go home ?PT Goal Formulation: With patient ?Time For Goal Achievement: 03/07/22 ?Potential to Achieve Goals: Good ? ?  ?Frequency BID ?  ? ? ?Co-evaluation   ?  ?  ?  ?  ? ? ?  ?AM-PAC PT "6 Clicks" Mobility  ?Outcome Measure Help needed  turning from your back to your side while in a flat bed without using bedrails?: A Lot ?Help needed moving from lying on your back to sitting on the side of a flat bed without using bedrails?: A Little ?Help needed moving to and from a bed to a chair (including a wheelchair)?: A Little ?Help needed standing up from a chair using your arms (e.g., wheelchair or bedside chair)?: A Little ?Help needed to walk in hospital room?: A Lot ?Help needed climbing 3-5 steps with a railing? : A Lot ?6 Click Score: 15 ? ?  ?End of Session   ?Activity Tolerance: Patient tolerated treatment well;Other (comment) (limited due to affects of linering anesthesia) ?Patient left: in bed;with call bell/phone within reach;with bed alarm set;with family/visitor present ?Nurse Communication: Mobility status ?PT Visit Diagnosis: Unsteadiness on feet (R26.81);Difficulty in walking, not elsewhere classified (R26.2);Muscle weakness (generalized) (M62.81);Pain ?Pain - Right/Left: Right ?Pain - part of body: Hip ?  ? ?Time: 1443-1510 ?PT Time Calculation (min) (ACUTE ONLY): 27 min ? ? ?Charges:   PT Evaluation ?$PT Eval Low Complexity: 1 Low ?PT Treatments ?$Therapeutic Exercise: 8-22 mins ?  ?   ? ?Angelica Ran, PT  ?02/21/22. 3:24 PM ? ? ?

## 2022-02-22 ENCOUNTER — Encounter: Payer: Self-pay | Admitting: Orthopedic Surgery

## 2022-02-22 DIAGNOSIS — M1611 Unilateral primary osteoarthritis, right hip: Secondary | ICD-10-CM | POA: Diagnosis not present

## 2022-02-22 LAB — CBC
HCT: 35.1 % — ABNORMAL LOW (ref 36.0–46.0)
Hemoglobin: 11.2 g/dL — ABNORMAL LOW (ref 12.0–15.0)
MCH: 26 pg (ref 26.0–34.0)
MCHC: 31.9 g/dL (ref 30.0–36.0)
MCV: 81.4 fL (ref 80.0–100.0)
Platelets: 227 10*3/uL (ref 150–400)
RBC: 4.31 MIL/uL (ref 3.87–5.11)
RDW: 18.5 % — ABNORMAL HIGH (ref 11.5–15.5)
WBC: 14.7 10*3/uL — ABNORMAL HIGH (ref 4.0–10.5)
nRBC: 0 % (ref 0.0–0.2)

## 2022-02-22 LAB — BASIC METABOLIC PANEL
Anion gap: 6 (ref 5–15)
BUN: 16 mg/dL (ref 8–23)
CO2: 26 mmol/L (ref 22–32)
Calcium: 8.5 mg/dL — ABNORMAL LOW (ref 8.9–10.3)
Chloride: 105 mmol/L (ref 98–111)
Creatinine, Ser: 0.79 mg/dL (ref 0.44–1.00)
GFR, Estimated: >60 mL/min (ref >60)
Glucose, Bld: 134 mg/dL — ABNORMAL HIGH (ref 70–99)
Potassium: 4.4 mmol/L (ref 3.5–5.1)
Sodium: 137 mmol/L (ref 135–145)

## 2022-02-22 MED ORDER — METHOCARBAMOL 500 MG PO TABS: 500.0000 mg | ORAL_TABLET | Freq: Four times a day (QID) | ORAL | 0 refills | Status: AC | PRN

## 2022-02-22 MED ORDER — HYDROCODONE-ACETAMINOPHEN 7.5-325 MG PO TABS: 1.0000 | ORAL_TABLET | ORAL | 0 refills | Status: DC | PRN

## 2022-02-22 MED ORDER — TRAMADOL HCL 50 MG PO TABS: 50.0000 mg | ORAL_TABLET | Freq: Four times a day (QID) | ORAL | 0 refills | Status: DC | PRN

## 2022-02-22 MED ORDER — POLYETHYLENE GLYCOL 3350 17 G PO PACK: 17.0000 g | PACK | Freq: Every day | ORAL | 0 refills | Status: AC | PRN

## 2022-02-22 MED ORDER — DOCUSATE SODIUM 100 MG PO CAPS
100.0000 mg | ORAL_CAPSULE | Freq: Two times a day (BID) | ORAL | 0 refills | Status: DC
Start: 2022-02-22 — End: 2022-08-14

## 2022-02-22 MED ORDER — ENOXAPARIN SODIUM 40 MG/0.4ML IJ SOSY: 40.0000 mg | PREFILLED_SYRINGE | INTRAMUSCULAR | 0 refills | Status: DC

## 2022-02-22 NOTE — Evaluation (Signed)
Occupational Therapy Evaluation ?Patient Details ?Name: Lindsey Woods ?MRN: [id number] ?DOB: Apr 01, 1956 ?Today's Date: 02/22/2022 ? ? ?History of Present Illness Patient is a 66 year old female s/p R anterior total hip replacement on 02/21/22  ? ?Clinical Impression ?  ?Pt seen for OT evaluation this date, POD#1 from above surgery. Pt was independent in all ADLs/IADLs prior to surgery. Pt is eager to return to PLOF with less pain and improved safety and independence. Pt currently requires MIN GUARD for seated LB dressing with AD (reacher and sock aide), MIN GUARD for functional mobility of household distances (86ft) with RW, MIN GUARD for toilet transfers (BSC frame placed over toilet), and SUPERVISION for standing grooming tasks d/t pain, limited AROM of R hip, and decreased balance. Pt instructed in falls prevention strategies, home/routines modifications, DME/AE for LB bathing/dressing tasks, and compression stocking mgt. Handout provided and pt verbalized understanding. Pt would benefit from additional skilled OT services including additional instruction in techniques with or without assistive devices for dressing and bathing skills to support recall and carryover prior to discharge and ultimately to maximize safety, independence, and minimize falls risk and caregiver burden. Do not currently anticipate any OT needs following this hospitalization.   ? ?Recommendations for follow up therapy are one component of a multi-disciplinary discharge planning process, led by the attending physician.  Recommendations may be updated based on patient status, additional functional criteria and insurance authorization.  ? ?Follow Up Recommendations ? No OT follow up  ?  ?Assistance Recommended at Discharge Set up Supervision/Assistance  ?Patient can return home with the following A little help with walking and/or transfers;A little help with bathing/dressing/bathroom;Assistance with cooking/housework;Assist for transportation ? ?   ?Functional Status Assessment ? Patient has had a recent decline in their functional status and demonstrates the ability to make significant improvements in function in a reasonable and predictable amount of time.  ?Equipment Recommendations ? BSC/3in1  ?  ?   ?Precautions / Restrictions Precautions ?Precautions: Anterior Hip;Fall ?Precaution Booklet Issued: Yes (comment) ?Restrictions ?Weight Bearing Restrictions: Yes ?RLE Weight Bearing: Weight bearing as tolerated  ? ?  ? ?Mobility Bed Mobility ?  ?  ?  ?  ?  ?  ?  ?General bed mobility comments: not assessed, pt in recliner pre/post session ?  ? ?Transfers ?Overall transfer level: Needs assistance ?Equipment used: Rolling walker (2 wheels) ?Transfers: Sit to/from Stand ?Sit to Stand: Min guard ?  ?  ?  ?  ?  ?  ?  ? ?  ?Balance Overall balance assessment: Needs assistance ?Sitting-balance support: Bilateral upper extremity supported, Feet supported ?Sitting balance-Leahy Scale: Fair ?  ?  ?Standing balance support: No upper extremity supported, During functional activity ?Standing balance-Leahy Scale: Fair ?Standing balance comment: Requires SUPERVISION for standing grooming tasks ?  ?  ?  ?  ?  ?  ?  ?  ?  ?  ?  ?   ? ?ADL either performed or assessed with clinical judgement  ? ?ADL Overall ADL's : Needs assistance/impaired ?  ?  ?Grooming: Wash/dry hands;Supervision/safety;Standing ?  ?  ?  ?  ?  ?  ?  ?Lower Body Dressing: Min guard;Sitting/lateral leans;With adaptive equipment ?Lower Body Dressing Details (indicate cue type and reason): to don/doff socks with AD (reacher and sock aide) ?Toilet Transfer: Min guard;BSC/3in1;Rolling walker (2 wheels) ?Toilet Transfer Details (indicate cue type and reason): Requires verbal cues for body positioning ?Toileting- Clothing Manipulation and Hygiene: Supervision/safety;Sitting/lateral lean ?  ?  ?  ?Functional  mobility during ADLs: Min guard;Rolling walker (2 wheels) (to walk 6730ft) ?   ? ? ? ?Vision Ability to See in  Adequate Light: 0 Adequate ?Patient Visual Report: No change from baseline ?   ?   ?   ?   ? ?Pertinent Vitals/Pain Pain Assessment ?Pain Assessment: 0-10 ?Pain Score: 4  ?Pain Descriptors / Indicators: Aching, Discomfort, Dull ?Pain Intervention(s): Limited activity within patient's tolerance, Monitored during session, Repositioned  ? ? ? ?Hand Dominance Right ?  ?Extremity/Trunk Assessment Upper Extremity Assessment ?Upper Extremity Assessment: Overall WFL for tasks assessed ?  ?Lower Extremity Assessment ?Lower Extremity Assessment: RLE deficits/detail ?RLE Deficits / Details: s/p THR ?  ?  ?  ?Communication Communication ?Communication: No difficulties ?  ?Cognition Arousal/Alertness: Awake/alert ?Behavior During Therapy: [facility] for tasks assessed/performed ?Overall Cognitive Status: Within Functional Limits for tasks assessed ?  ?  ?  ?  ?  ?  ?  ?  ?  ?  ?  ?  ?  ?  ?  ?  ?  ?  ?  ?   ?   ?   ? ? ?Home Living Family/patient expects to be discharged to:: Private residence ?Living Arrangements: Spouse/significant other ?Available Help at Discharge: Family ?Type of Home: House ?Home Access: Stairs to enter ?Entrance Stairs-Number of Steps: 3 ?Entrance Stairs-Rails: Right;Left ?Home Layout: One level ?  ?  ?Bathroom Shower/Tub: Walk-in shower ?  ?Bathroom Toilet: Standard ?  ?  ?Home Equipment: Grab bars - tub/shower;Adaptive equipment ?Adaptive Equipment: Reacher ?Additional Comments: walking stick. Family looking to purchase toilet riser with handles ?  ? ?  ?Prior Functioning/Environment Prior Level of Function : Independent/Modified Independent ?  ?  ?  ?  ?  ?  ?  ?ADLs Comments: Pt independent with ADLs/IADLs at baseline. Pt enjoys baking and working on her arm ?  ? ?  ?  ?OT Problem List: Decreased activity tolerance;Impaired balance (sitting and/or standing);Decreased range of motion;Decreased knowledge of use of DME or AE;Pain ?  ?   ?OT Treatment/Interventions: Self-care/ADL training;Therapeutic  exercise;DME and/or AE instruction;Therapeutic activities;Patient/family education;Balance training  ?  ?OT Goals(Current goals can be found in the care plan section) Acute Rehab OT Goals ?Patient Stated Goal: to regain independence ?OT Goal Formulation: With patient/family ?Time For Goal Achievement: 03/08/22 ?Potential to Achieve Goals: Good ?ADL Goals ?Pt Will Perform Grooming: with modified independence;standing ?Pt Will Perform Lower Body Dressing: with modified independence;sit to/from stand;with adaptive equipment ?Pt Will Transfer to Toilet: with modified independence;ambulating;bedside commode  ?OT Frequency: Min 2X/week ?  ? ?   ?AM-PAC OT "6 Clicks" Daily Activity     ?Outcome Measure Help from another person eating meals?: None ?Help from another person taking care of personal grooming?: A Little ?Help from another person toileting, which includes using toliet, bedpan, or urinal?: A Little ?Help from another person bathing (including washing, rinsing, drying)?: A Lot ?Help from another person to put on and taking off regular upper body clothing?: None ?Help from another person to put on and taking off regular lower body clothing?: A Little ?6 Click Score: 19 ?  ?End of Session Equipment Utilized During Treatment: Rolling walker (2 wheels) ?Nurse Communication: Mobility status ? ?Activity Tolerance: Patient tolerated treatment well ?Patient left: in chair;with call bell/phone within reach;with nursing/sitter in room;with family/visitor present ? ?OT Visit Diagnosis: Unsteadiness on feet (R26.81);Pain ?Pain - Right/Left: Right ?Pain - part of body: Hip  ?              ?  Time: 8315-1761 ?OT Time Calculation (min): 32 min ?Charges:  OT General Charges ?$OT Visit: 1 Visit ?OT Evaluation ?$OT Eval Moderate Complexity: 1 Mod ?OT Treatments ?$Self Care/Home Management : 23-37 mins ? ?Matthew Folks, OTR/L ?ASCOM [phone] ? ?

## 2022-02-22 NOTE — Progress Notes (Signed)
Physical Therapy Treatment ?Patient Details ?Name: Lindsey Woods ?MRN: [id number] ?DOB: [birth year] ?Today's Date: 02/22/2022 ? ? ?History of Present Illness Patient is a 66 year old female s/p R anterior total hip replacement on 02/21/22 ? ?  ?PT Comments  ? ? AM physical therapy session completed this date. Patient tolerated session well and was agreeable to treatment. Pain ranged from a 4/10 at rest to 8/10 with functional activity. Continues to require Min A for supine to sit bed mobility. CGA was required for sit to stand transfers and cueing required for proper hand placement. Patient was able to tolerate ambulating 1 lap around the nurses station at [facility] with RW and multiple standing recovery breaks due to pain. In future sessions plan to focus on steps and reviewing HEP packet with patient. Patient was left in recliner with family present and all needs met. Patient is progressing nicely towards her goals and would continue to benefit from skilled physical therapy in order optimize patient's review to PLOF. Continue to recommend HHPT upon discahrge from acute hospitalization.  ?  ?Recommendations for follow up therapy are one component of a multi-disciplinary discharge planning process, led by the attending physician.  Recommendations may be updated based on patient status, additional functional criteria and insurance authorization. ? ?Follow Up Recommendations ? Home health PT ?  ?  ?Assistance Recommended at Discharge Frequent or constant Supervision/Assistance  ?Patient can return home with the following A little help with walking and/or transfers;Help with stairs or ramp for entrance;A little help with bathing/dressing/bathroom ?  ?Equipment Recommendations ? Rolling walker (2 wheels)  ?  ?Recommendations for Other Services   ? ? ?  ?Precautions / Restrictions Precautions ?Precautions: Anterior Hip;Fall ?Precaution Booklet Issued: Yes (comment) ?Restrictions ?Weight Bearing Restrictions: Yes ?RLE Weight Bearing:  Weight bearing as tolerated  ?  ? ?Mobility ? Bed Mobility ?Overal bed mobility: Needs Assistance ?Bed Mobility: Supine to Sit ?  ?  ?Supine to sit: Min assist (through [facility] and assistance from hand rails) ?  ?  ?General bed mobility comments: ended session in recliner ?Patient Response: Cooperative ? ?Transfers ?Overall transfer level: Needs assistance ?Equipment used: Rolling walker (2 wheels) ?Transfers: Sit to/from Stand ?Sit to Stand: Min guard ?  ?  ?  ?  ?  ?  ?  ? ?Ambulation/Gait ?Ambulation/Gait assistance: Min guard ?Gait Distance (Feet): 200 Feet ?Assistive device: Rolling walker (2 wheels) ?Gait Pattern/deviations: Antalgic, Step-through pattern, Decreased step length - right, Decreased step length - left, Decreased stride length ?Gait velocity: decreased ?  ?  ?General Gait Details: required multiple standing recovery breaks due to increased pain ? ? ?Stairs ?  ?  ?  ?  ?  ? ? ?Wheelchair Mobility ?  ? ?Modified Rankin (Stroke Patients Only) ?  ? ? ?  ?Balance Overall balance assessment: Needs assistance ?Sitting-balance support: Bilateral upper extremity supported, Feet supported ?Sitting balance-Leahy Scale: Fair ?  ?Postural control: Posterior lean ?Standing balance support: Bilateral upper extremity supported, During functional activity, Reliant on assistive device for balance ?Standing balance-Leahy Scale: Fair ?  ?  ?  ?  ?  ?  ?  ?  ?  ?  ?  ?  ?  ? ?  ?Cognition Arousal/Alertness: Awake/alert ?Behavior During Therapy: [facility] for tasks assessed/performed ?Overall Cognitive Status: Within Functional Limits for tasks assessed ?  ?  ?  ?  ?  ?  ?  ?  ?  ?  ?  ?  ?  ?  ?  ?  ?  ?  ?  ? ?  ?  Exercises   ? ?  ?General Comments   ?  ?  ? ?Pertinent Vitals/Pain Pain Assessment ?Pain Assessment: 0-10 ?Pain Score: 8  ?Pain Location: R hip, 4 at rest, 8 with activity ?Pain Descriptors / Indicators: Aching, Discomfort, Dull, Burning ?Pain Intervention(s): Limited activity within patient's tolerance, Monitored  during session, Repositioned  ? ? ?Home Living   ?  ?  ?  ?  ?  ?  ?  ?  ?  ?   ?  ?Prior Function    ?  ?  ?   ? ?PT Goals (current goals can now be found in the care plan section) Acute Rehab PT Goals ?Patient Stated Goal: to go home ?PT Goal Formulation: With patient ?Time For Goal Achievement: 03/07/22 ?Potential to Achieve Goals: Good ?Progress towards PT goals: Progressing toward goals ? ?  ?Frequency ? ? ? BID ? ? ? ?  ?PT Plan Current plan remains appropriate  ? ? ?Co-evaluation   ?  ?  ?  ?  ? ?  ?AM-PAC PT "6 Clicks" Mobility   ?Outcome Measure ? Help needed turning from your back to your side while in a flat bed without using bedrails?: A Little ?Help needed moving from lying on your back to sitting on the side of a flat bed without using bedrails?: A Little ?Help needed moving to and from a bed to a chair (including a wheelchair)?: A Little ?Help needed standing up from a chair using your arms (e.g., wheelchair or bedside chair)?: A Little ?Help needed to walk in hospital room?: A Little ?Help needed climbing 3-5 steps with a railing? : A Lot ?6 Click Score: 17 ? ?  ?End of Session Equipment Utilized During Treatment: Gait belt ?Activity Tolerance: Patient tolerated treatment well;Patient limited by pain ?Patient left: in chair;with call bell/phone within reach;with family/visitor present ?Nurse Communication: Mobility status ?PT Visit Diagnosis: Unsteadiness on feet (R26.81);Difficulty in walking, not elsewhere classified (R26.2);Muscle weakness (generalized) (M62.81);Pain ?Pain - Right/Left: Right ?Pain - part of body: Hip ?  ? ? ?Time: [phone] ?PT Time Calculation (min) (ACUTE ONLY): 33 min ? ?Charges:  $Gait Training: 23-37 mins          ?          ? ?Iva Boop, PT  ?02/22/22. 10:02 AM ? ? ?

## 2022-02-22 NOTE — Progress Notes (Signed)
DISCHARGE NOTE: ? ?Pt given discharge instructions, pt verbalized understanding. [facility] wound vac in place. Walker sent with pt. Pt wheeled to car by staff. Husband providing transportation.  ?

## 2022-02-22 NOTE — Progress Notes (Signed)
Met with the patient in the room at the bedside ?They live at home with her husband ?They currently have DME listed, Grab bars, 3 in 1, reacher ?They need  a rolling walker adapt will deliver to the bedside ?They have transportation with their husband ?They can afford their medication ?They are set up with [facility] for Home health services set up by Surgeon's office prior to surgery ?

## 2022-02-22 NOTE — Discharge Summary (Signed)
Physician Discharge Summary  ?Patient ID: ?Lindsey Woods ?MRN: [id number] ?DOB/AGE: [birth year] 66 y.o. ? ?Admit date: 02/21/2022 ?Discharge date: 02/22/2022 ? ?Admission Diagnoses:  ?Status post total hip replacement, right [Z96.641] ? ? ?Discharge Diagnoses: ?Patient Active Problem List  ? Diagnosis Date Noted  ? Status post total hip replacement, right 02/21/2022  ? ? ?Past Medical History:  ?Diagnosis Date  ? Anemia   ? Arthritis   ? Asthma   ? well controlled  ? Colon polyps   ? Complication of anesthesia   ? woke up briefly during egd  ? Depression   ? GERD (gastroesophageal reflux disease)   ? Headache   ? h/o migraines  ? Obesity   ? ?  ?Transfusion: none ?  ?Consultants (if any):  ? ?Discharged Condition: Improved ? ?Hospital Course: Lindsey Woods is an 66 y.o. female who was admitted 02/21/2022 with a diagnosis of Status post total hip replacement, right and went to the operating room on 02/21/2022 and underwent the above named procedures.  ?  ?Surgeries: Procedure(s): ?TOTAL HIP ARTHROPLASTY ANTERIOR APPROACH on 02/21/2022 ?Patient tolerated the surgery well. Taken to PACU where she was stabilized and then transferred to the orthopedic floor. ? ?Started on Lovenox 40 mg q 24 hrs. Foot pumps applied bilaterally at 80 mm. Heels elevated on bed with rolled towels. No evidence of DVT. Negative Homan. ?Physical therapy started on day #1 for gait training and transfer. OT started day #1 for ADL and assisted devices. ? ?Patient's foley was d/c on day #1. Patient's IV  was d/c on day #1. ? ?On post op day #1 patient was stable and ready for discharge to home with HHPT. ? ? ? ?She was given perioperative antibiotics:  ?Anti-infectives (From admission, onward)  ? ? Start     Dose/Rate Route Frequency Ordered Stop  ? 02/21/22 1600  ceFAZolin (ANCEF) IVPB 2g/100 mL premix       ? 2 g ?200 mL/hr over 30 Minutes Intravenous Every 6 hours 02/21/22 1355 02/21/22 2302  ? 02/21/22 0808  ceFAZolin (ANCEF) 2-4 GM/100ML-% IVPB        ?Note to Pharmacy: Brain Hilts F: cabinet override  ?    02/21/22 0808 02/21/22 1043  ? 02/21/22 0600  ceFAZolin (ANCEF) IVPB 2g/100 mL premix       ? 2 g ?200 mL/hr over 30 Minutes Intravenous On call to O.R. 02/21/22 0233 02/21/22 1042  ? ?  ?. ? ?She was given sequential compression devices, early ambulation, and Lovenox TEDs for DVT prophylaxis. ? ?She benefited maximally from the hospital stay and there were no complications.   ? ?Recent vital signs:  ?Vitals:  ? 02/22/22 0714 02/22/22 1142  ?BP: 119/68 130/66  ?Pulse: 69 71  ?Resp: 12 16  ?Temp: 98 ?F (36.7 ?C) 97.9 ?F (36.6 ?C)  ?SpO2: 100% 100%  ? ? ?Recent laboratory studies:  ?Lab Results  ?Component Value Date  ? HGB 11.2 (L) 02/22/2022  ? HGB 12.6 02/21/2022  ? HGB 12.1 02/09/2022  ? ?Lab Results  ?Component Value Date  ? WBC 14.7 (H) 02/22/2022  ? PLT 227 02/22/2022  ? ?No results found for: INR ?Lab Results  ?Component Value Date  ? NA 137 02/22/2022  ? K 4.4 02/22/2022  ? CL 105 02/22/2022  ? CO2 26 02/22/2022  ? BUN 16 02/22/2022  ? CREATININE 0.79 02/22/2022  ? GLUCOSE 134 (H) 02/22/2022  ? ? ?Discharge Medications:   ?Allergies as of 02/22/2022   ?No Known Allergies ?  ? ?  ?  Medication List  ?  ? ?STOP taking these medications   ? ?acetaminophen 325 MG tablet ?Commonly known as: TYLENOL ?  ?ibuprofen 200 MG tablet ?Commonly known as: ADVIL ?  ? ?  ? ?TAKE these medications   ? ?albuterol 108 (90 Base) MCG/ACT inhaler ?Commonly known as: VENTOLIN HFA ?Inhale 1-2 puffs into the lungs every 6 (six) hours as needed for wheezing or shortness of breath. ?  ?cetirizine 10 MG tablet ?Commonly known as: ZYRTEC ?Take 10 mg by mouth every morning. ?  ?docusate sodium 100 MG capsule ?Commonly known as: COLACE ?Take 1 capsule (100 mg total) by mouth 2 (two) times daily. ?  ?DULoxetine 60 MG capsule ?Commonly known as: CYMBALTA ?Take 60 mg by mouth in the morning. ?  ?enoxaparin 40 MG/0.4ML injection ?Commonly known as: LOVENOX ?Inject 0.4 mLs (40 mg total)  into the skin daily for 14 days. ?Start taking on: February 23, 2022 ?  ?gabapentin 300 MG capsule ?Commonly known as: NEURONTIN ?Take 300-600 mg by mouth 3 (three) times daily. ?  ?HYDROcodone-acetaminophen 7.5-325 MG tablet ?Commonly known as: NORCO ?Take 1-2 tablets by mouth every 4 (four) hours as needed for severe pain (pain score 7-10). ?  ?MAGNESIUM PO ?Take 1 tablet by mouth in the morning. ?  ?methocarbamol 500 MG tablet ?Commonly known as: ROBAXIN ?Take 1 tablet (500 mg total) by mouth every 6 (six) hours as needed for muscle spasms. ?  ?omeprazole 20 MG capsule ?Commonly known as: PRILOSEC ?Take 20 mg by mouth every other day. In the morning. ?  ?penicillin v potassium 250 MG tablet ?Commonly known as: VEETID ?Take 250 mg by mouth 4 (four) times daily. ?  ?polyethylene glycol 17 g packet ?Commonly known as: MIRALAX / GLYCOLAX ?Take 17 g by mouth daily as needed for mild constipation. ?  ?traMADol 50 MG tablet ?Commonly known as: ULTRAM ?Take 1 tablet (50 mg total) by mouth every 6 (six) hours as needed. ?  ?VITAMIN B-12 PO ?Take 1 tablet by mouth in the morning. ?  ?VITAMIN D3 PO ?Take 1 tablet by mouth in the morning. ?  ? ?  ? ?  ?  ? ? ?  ?Durable Medical Equipment  ?(From admission, onward)  ?  ? ? ?  ? ?  Start     Ordered  ? 02/21/22 1215  DME Walker rolling  Once       ?Question Answer Comment  ?Walker: With 5 Inch Wheels   ?Patient needs a walker to treat with the following condition Status post total hip replacement, right   ?  ? 02/21/22 1214  ? 02/21/22 1215  DME 3 n 1  Once       ? 02/21/22 1214  ? 02/21/22 1215  DME Bedside commode  Once       ?Question:  Patient needs a bedside commode to treat with the following condition  Answer:  Status post total hip replacement, right  ? 02/21/22 1214  ? ?  ?  ? ?  ? ? ?Diagnostic Studies: DG C-Arm 1-60 Min-No Report ? ?Result Date: 02/21/2022 ?Fluoroscopy was utilized by the requesting physician.  No radiographic interpretation.  ? ?DG HIP UNILAT WITH PELVIS  1V RIGHT ? ?Result Date: 02/21/2022 ?CLINICAL DATA:  RIGHT total hip replacement EXAM: DG HIP (WITH OR WITHOUT PELVIS) 1V RIGHT COMPARISON:  None Dose: 1.3326 mGy cumulative air kerma FINDINGS: Three images obtained. Images demonstrate resection of the RIGHT femoral head/neck and placement of a RIGHT hip  prosthesis. No fracture, dislocation or bone destruction identified on limited AP imaging. IMPRESSION: RIGHT hip prosthesis without acute complication. Electronically Signed   By: Ulyses Southward M.D.   On: 02/21/2022 12:10  ? ?DG HIP UNILAT W OR W/O PELVIS 2-3 VIEWS RIGHT ? ?Result Date: 02/21/2022 ?CLINICAL DATA:  Postop right hip. EXAM: DG HIP (WITH OR WITHOUT PELVIS) 2-3V RIGHT COMPARISON:  None. FINDINGS: Status post right hip arthroplasty. No perihardware fracture. Surgical drain and multiple surgical clips with subcutaneous emphysema. IMPRESSION: Status post right hip arthroplasty without evidence of acute complications. Electronically Signed   By: Larose Hires D.O.   On: 02/21/2022 12:44   ? ?Disposition: Discharge disposition: 06-Home-Health Care Svc ? ? ? ? ? ? ? ? ? Follow-up Information   ? ? Evon Slack, PA-C Follow up in 2 week(s).   ?Specialties: Orthopedic Surgery, Emergency Medicine ?Contact information: ?[address] ?[place] [address] ?[phone] ? ? ?  ?  ? ?  ?  ? ?  ? ? ? ?Signed: ?Amador Cunas CHRISTOPHER ?02/22/2022, 1:05 PM ? ? ?  ?

## 2022-02-22 NOTE — Progress Notes (Signed)
Physical Therapy Treatment ?Patient Details ?Name: Lindsey Woods ?MRN: [id number] ?DOB: 05/10/1956 ?Today's Date: 02/22/2022 ? ? ?History of Present Illness Patient is a 66 year old female s/p R anterior total hip replacement on 02/21/22 ? ?  ?PT Comments  ? ? PM session completed this date. Patient tolerated session well and was agreeable to treatment. Patient received sitting in recliner with 4/10 pain at rest, that increases with physical activity.  Bed mobility, transfers, and ambulation were completed at supervision. Patient was able to continuously ambulate from recliner>therapy room steps>to patient's room with RW. Patient was educated on proper stair sequencing, and was able to demonstrate 4 steps at CGA/SBA. Patient utilized BUE support on R hand rail, and completed a lateral step to pattern up the steps. Patient left in bed with all needs met. Patient would continue to benefit from skilled physical therapy in order to optimize patient's return to PLOF. Continue to recommend HHPT upon discharge from acute hospitalization.  ?  ?Recommendations for follow up therapy are one component of a multi-disciplinary discharge planning process, led by the attending physician.  Recommendations may be updated based on patient status, additional functional criteria and insurance authorization. ? ?Follow Up Recommendations ? Home health PT ?  ?  ?Assistance Recommended at Discharge Frequent or constant Supervision/Assistance  ?Patient can return home with the following A little help with walking and/or transfers;Help with stairs or ramp for entrance;A little help with bathing/dressing/bathroom ?  ?Equipment Recommendations ? Rolling walker (2 wheels)  ?  ?Recommendations for Other Services   ? ? ?  ?Precautions / Restrictions Precautions ?Precautions: Anterior Hip;Fall ?Precaution Booklet Issued: Yes (comment) ?Restrictions ?Weight Bearing Restrictions: Yes ?RLE Weight Bearing: Weight bearing as tolerated  ?  ? ?Mobility ? Bed  Mobility ?Overal bed mobility: Needs Assistance ?Bed Mobility: Sit to Supine ?  ?  ?Supine to sit: Supervision ?  ?  ?General bed mobility comments: patient hooked LLE under RLE to transition back into bed, no physical assistance required ?Patient Response: Cooperative ? ?Transfers ?Overall transfer level: Needs assistance ?Equipment used: Rolling walker (2 wheels) ?Transfers: Sit to/from Stand ?Sit to Stand: Supervision ?  ?  ?  ?  ?  ?General transfer comment: no physical assistance required ?  ? ?Ambulation/Gait ?Ambulation/Gait assistance: Supervision ?Gait Distance (Feet): 200 Feet ?Assistive device: Rolling walker (2 wheels) ?Gait Pattern/deviations: Antalgic, Step-through pattern, Decreased step length - right, Decreased step length - left, Decreased stride length ?Gait velocity: decreased ?  ?  ?General Gait Details: required multiple standing recovery breaks due to increased pain ? ? ?Stairs ?Stairs: Yes ?Stairs assistance: Min guard (SBA) ?Stair Management: One rail Right, Step to pattern ?Number of Stairs: 4 ?General stair comments: Patient educated on proper stair sequencing ? ? ?Wheelchair Mobility ?  ? ?Modified Rankin (Stroke Patients Only) ?  ? ? ?  ?Balance Overall balance assessment: Needs assistance ?Sitting-balance support: Bilateral upper extremity supported, Feet supported ?Sitting balance-Leahy Scale: Fair ?  ?Postural control: Posterior lean ?Standing balance support: No upper extremity supported, During functional activity, Reliant on assistive device for balance ?Standing balance-Leahy Scale: Fair ?  ?  ?  ?  ?  ?  ?  ?  ?  ?  ?  ?  ?  ? ?  ?Cognition Arousal/Alertness: Awake/alert ?Behavior During Therapy: [facility] for tasks assessed/performed ?Overall Cognitive Status: Within Functional Limits for tasks assessed ?  ?  ?  ?  ?  ?  ?  ?  ?  ?  ?  ?  ?  ?  ?  ?  ?  ?  ?  ? ?  ?  Exercises Other Exercises ?Other Exercises: Reviewed HEP packet ? ?  ?General Comments   ?  ?  ? ?Pertinent Vitals/Pain  Pain Assessment ?Pain Assessment: 0-10 ?Pain Score: 7  ?Pain Location: R hip: 4 at rest, 7 with activity ?Pain Descriptors / Indicators: Aching, Discomfort, Dull, Burning ?Pain Intervention(s): Monitored during session, Limited activity within patient's tolerance, Repositioned  ? ? ?Home Living Family/patient expects to be discharged to:: Private residence ?Living Arrangements: Spouse/significant other ?Available Help at Discharge: Family ?Type of Home: House ?Home Access: Stairs to enter ?Entrance Stairs-Rails: Right;Left ?Entrance Stairs-Number of Steps: 3 ?  ?Home Layout: One level ?Home Equipment: Grab bars - tub/shower;Adaptive equipment ?Additional Comments: walking stick. Family looking to purchase toilet riser with handles  ?  ?Prior Function    ?  ?  ?   ? ?PT Goals (current goals can now be found in the care plan section) Acute Rehab PT Goals ?Patient Stated Goal: to go home ?PT Goal Formulation: With patient ?Time For Goal Achievement: 03/07/22 ?Potential to Achieve Goals: Good ?Progress towards PT goals: Progressing toward goals ? ?  ?Frequency ? ? ? BID ? ? ? ?  ?PT Plan Current plan remains appropriate  ? ? ?Co-evaluation   ?  ?  ?  ?  ? ?  ?AM-PAC PT "6 Clicks" Mobility   ?Outcome Measure ? Help needed turning from your back to your side while in a flat bed without using bedrails?: A Little ?Help needed moving from lying on your back to sitting on the side of a flat bed without using bedrails?: A Little ?Help needed moving to and from a bed to a chair (including a wheelchair)?: A Little ?Help needed standing up from a chair using your arms (e.g., wheelchair or bedside chair)?: A Little ?Help needed to walk in hospital room?: A Little ?Help needed climbing 3-5 steps with a railing? : A Little ?6 Click Score: 18 ? ?  ?End of Session Equipment Utilized During Treatment: Gait belt ?Activity Tolerance: Patient tolerated treatment well;Patient limited by pain ?Patient left: in bed;with call bell/phone  within reach ?Nurse Communication: Mobility status ?PT Visit Diagnosis: Unsteadiness on feet (R26.81);Difficulty in walking, not elsewhere classified (R26.2);Muscle weakness (generalized) (M62.81);Pain ?Pain - Right/Left: Right ?Pain - part of body: Hip ?  ? ? ?Time: 2863-8177 ?PT Time Calculation (min) (ACUTE ONLY): 29 min ? ?Charges:  $Gait Training: 8-22 mins ?$Therapeutic Activity: 8-22 mins          ?          ? ?Iva Boop, PT  ?02/22/22. 1:56 PM ? ? ?

## 2022-02-22 NOTE — Discharge Instructions (Signed)
?ANTERIOR APPROACH TOTAL HIP REPLACEMENT POSTOPERATIVE DIRECTIONS ? ? ?Hip Rehabilitation, Guidelines Following Surgery  ?The results of a hip operation are greatly improved after range of motion and muscle strengthening exercises. Follow all safety measures which are given to protect your hip. If any of these exercises cause increased pain or swelling in your joint, decrease the amount until you are comfortable again. Then slowly increase the exercises. Call your caregiver if you have problems or questions.  ? ?HOME CARE INSTRUCTIONS  ?Remove items at home which could result in a fall. This includes throw rugs or furniture in walking pathways.  ?ICE to the affected hip every three hours for 30 minutes at a time and then as needed for pain and swelling.  Continue to use ice on the hip for pain and swelling from surgery. You may notice swelling that will progress down to the foot and ankle.  This is normal after surgery.  Elevate the leg when you are not up walking on it.   ?Continue to use the breathing machine which will help keep your temperature down.  It is common for your temperature to cycle up and down following surgery, especially at night when you are not up moving around and exerting yourself.  The breathing machine keeps your lungs expanded and your temperature down. ?Do not place pillow under knee, focus on keeping the knee straight while resting ? ?DIET ?You may resume your previous home diet once your are discharged from the hospital. ? ?DRESSING / WOUND CARE / SHOWERING ?Please remove provena negative pressure dressing on 03/01/2022 and apply honey comb dressing. Keep dressing clean and dry at all times.  ? ?ACTIVITY ?Walk with your walker as instructed. ?Use walker as long as suggested by your caregivers. ?Avoid periods of inactivity such as sitting longer than an hour when not asleep. This helps prevent blood clots.  ?You may resume a sexual relationship in one month or when given the OK by your  doctor.  ?You may return to work once you are cleared by your doctor.  ?Do not drive a car for 6 weeks or until released by you surgeon.  ?Do not drive while taking narcotics. ? ?WEIGHT BEARING ?Weight bearing as tolerated. Use walker/cane as needed for at least 4 weeks post op. ? ?POSTOPERATIVE CONSTIPATION PROTOCOL ?Constipation - defined medically as fewer than three stools per week and severe constipation as less than one stool per week. ? ?One of the most common issues patients have following surgery is constipation.  Even if you have a regular bowel pattern at home, your normal regimen is likely to be disrupted due to multiple reasons following surgery.  Combination of anesthesia, postoperative narcotics, change in appetite and fluid intake all can affect your bowels.  In order to avoid complications following surgery, here are some recommendations in order to help you during your recovery period. ? ?Colace (docusate) - Pick up an over-the-counter form of Colace or another stool softener and take twice a day as long as you are requiring postoperative pain medications.  Take with a full glass of water daily.  If you experience loose stools or diarrhea, hold the colace until you stool forms back up.  If your symptoms do not get better within 1 week or if they get worse, check with your doctor. ? ?Dulcolax (bisacodyl) - Pick up over-the-counter and take as directed by the product packaging as needed to assist with the movement of your bowels.  Take with a full glass of  water.  Use this product as needed if not relieved by Colace only.  ? ?MiraLax (polyethylene glycol) - Pick up over-the-counter to have on hand.  MiraLax is a solution that will increase the amount of water in your bowels to assist with bowel movements.  Take as directed and can mix with a glass of water, juice, soda, coffee, or tea.  Take if you go more than two days without a movement. ?Do not use MiraLax more than once per day. Call your doctor  if you are still constipated or irregular after using this medication for 7 days in a row. ? ?If you continue to have problems with postoperative constipation, please contact the office for further assistance and recommendations.  If you experience "the worst abdominal pain ever" or develop nausea or vomiting, please contact the office immediatly for further recommendations for treatment. ? ?ITCHING ? If you experience itching with your medications, try taking only a single pain pill, or even half a pain pill at a time.  You can also use Benadryl over the counter for itching or also to help with sleep.  ? ?TED HOSE STOCKINGS ?Wear the elastic stockings on both legs for six weeks following surgery during the day but you may remove then at night for sleeping. ? ?MEDICATIONS ?See your medication summary on the ?After Visit Summary? that the nursing staff will review with you prior to discharge.  You may have some home medications which will be placed on hold until you complete the course of blood thinner medication.  It is important for you to complete the blood thinner medication as prescribed by your surgeon.  Continue your approved medications as instructed at time of discharge. ? ?PRECAUTIONS ?If you experience chest pain or shortness of breath - call 911 immediately for transfer to the hospital emergency department.  ?If you develop a fever greater that 101 F, purulent drainage from wound, increased redness or drainage from wound, foul odor from the wound/dressing, or calf pain - CONTACT YOUR SURGEON.   ?                                                ?FOLLOW-UP APPOINTMENTS ?Make sure you keep all of your appointments after your operation with your surgeon and caregivers. You should call the office at the above phone number and make an appointment for approximately two weeks after the date of your surgery or on the date instructed by your surgeon outlined in the "After Visit Summary". ? ?RANGE OF MOTION AND  STRENGTHENING EXERCISES  ?These exercises are designed to help you keep full movement of your hip joint. Follow your caregiver's or physical therapist's instructions. Perform all exercises about fifteen times, three times per day or as directed. Exercise both hips, even if you have had only one joint replacement. These exercises can be done on a training (exercise) mat, on the floor, on a table or on a bed. Use whatever works the best and is most comfortable for you. Use music or television while you are exercising so that the exercises are a pleasant break in your day. This will make your life better with the exercises acting as a break in routine you can look forward to.  ?Lying on your back, slowly slide your foot toward your buttocks, raising your knee up off the floor. Then slowly slide your  foot back down until your leg is straight again.  ?Lying on your back spread your legs as far apart as you can without causing discomfort.  ?Lying on your side, raise your upper leg and foot straight up from the floor as far as is comfortable. Slowly lower the leg and repeat.  ?Lying on your back, tighten up the muscle in the front of your thigh (quadriceps muscles). You can do this by keeping your leg straight and trying to raise your heel off the floor. This helps strengthen the largest muscle supporting your knee.  ?Lying on your back, tighten up the muscles of your buttocks both with the legs straight and with the knee bent at a comfortable angle while keeping your heel on the floor.  ? ?IF YOU ARE TRANSFERRED TO A SKILLED REHAB FACILITY ?If the patient is transferred to a skilled rehab facility following release from the hospital, a list of the current medications will be sent to the facility for the patient to continue.  When discharged from the skilled rehab facility, please have the facility set up the patient's Home Health Physical Therapy prior to being released. Also, the skilled facility will be responsible for  providing the patient with their medications at time of release from the facility to include their pain medication, the muscle relaxants, and their blood thinner medication. If the patient is still at the reh

## 2022-02-22 NOTE — Progress Notes (Signed)
? ?  Subjective: ?1 Day Post-Op Procedure(s) (LRB): ?TOTAL HIP ARTHROPLASTY ANTERIOR APPROACH (Right) ?Patient reports pain as mild.   ?Patient is well, and has had no acute complaints or problems ?Denies any CP, SOB, ABD pain. ?We will continue therapy today.  ?Plan is to go Home after hospital stay. ? ?Objective: ?Vital signs in last 24 hours: ?Temp:  [96.3 ?F (35.7 ?C)-98 ?F (36.7 ?C)] 98 ?F (36.7 ?C) (04/05 4401) ?Pulse Rate:  [57-84] 69 (04/05 0714) ?Resp:  [10-20] 12 (04/05 0714) ?BP: (105-180)/(53-84) 119/68 (04/05 0272) ?SpO2:  [98 %-100 %] 100 % (04/05 0714) ?Weight:  [89.8 kg] 89.8 kg (04/04 0819) ? ?Intake/Output from previous day: ?04/04 0701 - 04/05 0700 ?In: 1396.2 [I.V.:1186.2; IV Piggyback:210] ?Out: 3575 [Urine:3425; Blood:150] ?Intake/Output this shift: ?No intake/output data recorded. ? ?Recent Labs  ?  02/21/22 ?1425 02/22/22 ?0359  ?HGB 12.6 11.2*  ? ?Recent Labs  ?  02/21/22 ?1425 02/22/22 ?0359  ?WBC 9.1 14.7*  ?RBC 4.78 4.31  ?HCT 39.9 35.1*  ?PLT 219 227  ? ?Recent Labs  ?  02/21/22 ?1425 02/22/22 ?0359  ?NA  --  137  ?K  --  4.4  ?CL  --  105  ?CO2  --  26  ?BUN  --  16  ?CREATININE 0.89 0.79  ?GLUCOSE  --  134*  ?CALCIUM  --  8.5*  ? ?No results for input(s): LABPT, INR in the last 72 hours. ? ?EXAM ?General - Patient is Alert, Appropriate, and Oriented ?Extremity - Neurovascular intact ?Sensation intact distally ?Intact pulses distally ?Dorsiflexion/Plantar flexion intact ?No cellulitis present ?Compartment soft ?Dressing - dressing C/D/I and no drainage, provena  ?Motor Function - intact, moving foot and toes well on exam.  ? ?Past Medical History:  ?Diagnosis Date  ? Anemia   ? Arthritis   ? Asthma   ? well controlled  ? Colon polyps   ? Complication of anesthesia   ? woke up briefly during egd  ? Depression   ? GERD (gastroesophageal reflux disease)   ? Headache   ? h/o migraines  ? Obesity   ? ? ?Assessment/Plan:   ?1 Day Post-Op Procedure(s) (LRB): ?TOTAL HIP ARTHROPLASTY ANTERIOR  APPROACH (Right) ?Principal Problem: ?  Status post total hip replacement, right ? ?Estimated body mass index is 32.95 kg/m? as calculated from the following: ?  Height as of this encounter: 5\' 5"  (1.651 m). ?  Weight as of this encounter: 89.8 kg. ?Advance diet ?Up with therapy ?Labs Stable ?VSS ?Pain controlled ?Care management to assist with discharge to home with home health PT pending completion of PT goals. ? ?DVT Prophylaxis - Lovenox, TED hose, and SCDs ?Weight-Bearing as tolerated to right leg ? ? ?T. , PA-C ?[facility] ?02/22/2022, 8:07 AM ?  ?

## 2022-02-22 NOTE — Plan of Care (Signed)

## 2022-02-23 LAB — SURGICAL PATHOLOGY

## 2022-08-07 ENCOUNTER — Other Ambulatory Visit: Payer: Self-pay | Admitting: Orthopedic Surgery

## 2022-08-14 ENCOUNTER — Telehealth: Payer: Self-pay | Admitting: Urgent Care

## 2022-08-14 ENCOUNTER — Encounter
Admission: RE | Admit: 2022-08-14 | Discharge: 2022-08-14 | Disposition: A | Discharge status: Home / Self Care | Payer: BC Managed Care – PPO | Source: Ambulatory Visit | Attending: Orthopedic Surgery | Admitting: Orthopedic Surgery

## 2022-08-14 DIAGNOSIS — Z01812 Encounter for preprocedural laboratory examination: Secondary | ICD-10-CM

## 2022-08-14 DIAGNOSIS — Z22322 Carrier or suspected carrier of Methicillin resistant Staphylococcus aureus: Secondary | ICD-10-CM

## 2022-08-14 DIAGNOSIS — Z01818 Encounter for other preprocedural examination: Secondary | ICD-10-CM | POA: Insufficient documentation

## 2022-08-14 HISTORY — DX: Osteoarthritis of hip, unspecified: M16.9

## 2022-08-14 LAB — COMPREHENSIVE METABOLIC PANEL
ALT: 12 U/L (ref 0–44)
AST: 19 U/L (ref 15–41)
Albumin: 3.8 g/dL (ref 3.5–5.0)
Alkaline Phosphatase: 77 U/L (ref 38–126)
Anion gap: 7 (ref 5–15)
BUN: 19 mg/dL (ref 8–23)
CO2: 27 mmol/L (ref 22–32)
Calcium: 9 mg/dL (ref 8.9–10.3)
Chloride: 107 mmol/L (ref 98–111)
Creatinine, Ser: 0.79 mg/dL (ref 0.44–1.00)
GFR, Estimated: >60 mL/min (ref >60)
Glucose, Bld: 86 mg/dL (ref 70–99)
Potassium: 3.9 mmol/L (ref 3.5–5.1)
Sodium: 141 mmol/L (ref 135–145)
Total Bilirubin: 0.5 mg/dL (ref 0.3–1.2)
Total Protein: 6.7 g/dL (ref 6.5–8.1)

## 2022-08-14 LAB — CBC WITH DIFFERENTIAL/PLATELET
Abs Immature Granulocytes: 0.01 10*3/uL (ref 0.00–0.07)
Basophils Absolute: 0.1 10*3/uL (ref 0.0–0.1)
Basophils Relative: 1 %
Eosinophils Absolute: 0.4 10*3/uL (ref 0.0–0.5)
Eosinophils Relative: 6 %
HCT: 36 % (ref 36.0–46.0)
Hemoglobin: 10.8 g/dL — ABNORMAL LOW (ref 12.0–15.0)
Immature Granulocytes: 0 %
Lymphocytes Relative: 30 %
Lymphs Abs: 1.9 10*3/uL (ref 0.7–4.0)
MCH: 23.7 pg — ABNORMAL LOW (ref 26.0–34.0)
MCHC: 30 g/dL (ref 30.0–36.0)
MCV: 79.1 fL — ABNORMAL LOW (ref 80.0–100.0)
Monocytes Absolute: 0.5 10*3/uL (ref 0.1–1.0)
Monocytes Relative: 7 %
Neutro Abs: 3.6 10*3/uL (ref 1.7–7.7)
Neutrophils Relative %: 56 %
Platelets: 230 10*3/uL (ref 150–400)
RBC: 4.55 MIL/uL (ref 3.87–5.11)
RDW: 16.5 % — ABNORMAL HIGH (ref 11.5–15.5)
WBC: 6.5 10*3/uL (ref 4.0–10.5)
nRBC: 0 % (ref 0.0–0.2)

## 2022-08-14 LAB — URINALYSIS, ROUTINE W REFLEX MICROSCOPIC
Bilirubin Urine: NEGATIVE
Glucose, UA: NEGATIVE mg/dL
Ketones, ur: NEGATIVE mg/dL
Leukocytes,Ua: NEGATIVE
Nitrite: NEGATIVE
Protein, ur: NEGATIVE mg/dL
Specific Gravity, Urine: 1.014 (ref 1.005–1.030)
pH: 6 (ref 5.0–8.0)

## 2022-08-14 LAB — TYPE AND SCREEN
ABO/RH(D): O NEG
Antibody Screen: NEGATIVE

## 2022-08-14 LAB — SURGICAL PCR SCREEN
MRSA, PCR: POSITIVE — AB
Staphylococcus aureus: POSITIVE — AB

## 2022-08-14 MED ORDER — MUPIROCIN 2 % EX OINT: TOPICAL_OINTMENT | CUTANEOUS | 0 refills | Status: AC

## 2022-08-14 NOTE — Patient Instructions (Signed)
Your procedure is scheduled on: Monday, October 2 Report to the Registration Desk on the 1st floor of the [facility]. To find out your arrival time, please call [phone] between 1PM - 3PM on: Friday, September 29 If your arrival time is 6:00 am, do not arrive prior to that time as the [place] entrance doors do not open until 6:00 am.  REMEMBER: Instructions that are not followed completely may result in serious medical risk, up to and including death; or upon the discretion of your surgeon and anesthesiologist your surgery may need to be rescheduled.  Do not eat food after midnight the night before surgery.  No gum chewing, lozengers or hard candies.  You may however, drink CLEAR liquids up to 2 hours before you are scheduled to arrive for your surgery. Do not drink anything within 2 hours of your scheduled arrival time.  Clear liquids include: - water  - apple juice without pulp - gatorade (not RED colors) - black coffee or tea (Do NOT add milk or creamers to the coffee or tea) Do NOT drink anything that is not on this list.  In addition, your doctor has ordered for you to drink the provided  Ensure Pre-Surgery Clear Carbohydrate Drink  Drinking this carbohydrate drink up to two hours before surgery helps to reduce insulin resistance and improve patient outcomes. Please complete drinking 2 hours prior to scheduled arrival time.  TAKE THESE MEDICATIONS THE MORNING OF SURGERY WITH A SIP OF WATER:  Albuterol inhaler Duloxetine (Cymbalta) Omeprazole (Prilosec) - (take one the night before and one on the morning of surgery - helps to prevent nausea after surgery.)  Use inhalers on the day of surgery and bring to the hospital.  One week prior to surgery: starting September 25 Stop Anti-inflammatories (NSAIDS) such as Advil, Aleve, Ibuprofen, Motrin, Naproxen, Naprosyn and Aspirin based products such as Excedrin, Goodys Powder, BC Powder. Stop ANY OVER THE COUNTER supplements  until after surgery. You may however, continue to take Tylenol if needed for pain up until the day of surgery.  No Alcohol for 24 hours before or after surgery.  No Smoking including e-cigarettes for 24 hours prior to surgery.  No chewable tobacco products for at least 6 hours prior to surgery.  No nicotine patches on the day of surgery.  Do not use any "recreational" drugs for at least a week prior to your surgery.  Please be advised that the combination of cocaine and anesthesia may have negative outcomes, up to and including death. If you test positive for cocaine, your surgery will be cancelled.  On the morning of surgery brush your teeth with toothpaste and water, you may rinse your mouth with mouthwash if you wish. Do not swallow any toothpaste or mouthwash.  Use CHG Soap as directed on instruction sheet.  Do not wear jewelry, make-up, hairpins, clips or nail polish.  Do not wear lotions, powders, or perfumes.   Do not shave body from the neck down 48 hours prior to surgery just in case you cut yourself which could leave a site for infection.  Also, freshly shaved skin may become irritated if using the CHG soap.  Contact lenses, hearing aids and dentures may not be worn into surgery.  Do not bring valuables to the hospital. [facility] is not responsible for any missing/lost belongings or valuables.   Notify your doctor if there is any change in your medical condition (cold, fever, infection).  Wear comfortable clothing (specific to your surgery  type) to the hospital.  After surgery, you can help prevent lung complications by doing breathing exercises.  Take deep breaths and cough every 1-2 hours. Your doctor may order a device called an Incentive Spirometer to help you take deep breaths.  If you are being admitted to the hospital overnight, leave your suitcase in the car. After surgery it may be brought to your room.  If you are being discharged the day of surgery, you  will not be allowed to drive home. You will need a responsible adult (18 years or older) to drive you home and stay with you that night.   If you are taking public transportation, you will need to have a responsible adult (18 years or older) with you. Please confirm with your physician that it is acceptable to use public transportation.   Please call the [place] Dept. at [phone] if you have any questions about these instructions.  Surgery Visitation Policy:  Patients undergoing a surgery or procedure may have two family members or support persons with them as long as the person is not COVID-19 positive or experiencing its symptoms.   Inpatient Visitation:    Visiting hours are 7 a.m. to 8 p.m. Up to four visitors are allowed at one time in a patient room, including children. The visitors may rotate out with other people during the day. One designated support person (adult) may remain overnight.      Preparing for Surgery with CHLORHEXIDINE GLUCONATE (CHG) Soap  Chlorhexidine Gluconate (CHG) Soap  o An antiseptic cleaner that kills germs and bonds with the skin to continue killing germs even after washing  o Used for showering the night before surgery and morning of surgery  Before surgery, you can play an important role by reducing the number of germs on your skin.  CHG (Chlorhexidine gluconate) soap is an antiseptic cleanser which kills germs and bonds with the skin to continue killing germs even after washing.  Please do not use if you have an allergy to CHG or antibacterial soaps. If your skin becomes reddened/irritated stop using the CHG.  1. Shower the NIGHT BEFORE SURGERY and the MORNING OF SURGERY with CHG soap.  2. If you choose to wash your hair, wash your hair first as usual with your normal shampoo.  3. After shampooing, rinse your hair and body thoroughly to remove the shampoo.  4. Use CHG as you would any other liquid soap. You can apply CHG  directly to the skin and wash gently with a scrungie or a clean washcloth.  5. Apply the CHG soap to your body only from the neck down. Do not use on open wounds or open sores. Avoid contact with your eyes, ears, mouth, and genitals (private parts). Wash face and genitals (private parts) with your normal soap.  6. Wash thoroughly, paying special attention to the area where your surgery will be performed.  7. Thoroughly rinse your body with warm water.  8. Do not shower/wash with your normal soap after using and rinsing off the CHG soap.  9. Pat yourself dry with a clean towel.  10. Wear clean pajamas to bed the night before surgery.  12. Place clean sheets on your bed the night of your first shower and do not sleep with pets.  13. Shower again with the CHG soap on the day of surgery prior to arriving at the hospital.  14. Do not apply any deodorants/lotions/powders.  15. Please wear clean clothes to the hospital.

## 2022-08-14 NOTE — Progress Notes (Signed)
  Perioperative Services  Abnormal Lab Notification and Treatment Plan of Care  Date: 08/14/22  Name: Lindsey Woods MRN:   [id number]  Re: Abnormal labs noted during PAT appointment  Provider Notified: Steffanie Rainwater, MD Notification mode: Routed and/or faxed via [place] of concern: Lab Results  Component Value Date   STAPHAUREUS POSITIVE (A) 08/14/2022   MRSAPCR POSITIVE (A) 08/14/2022    Notes: Patient is scheduled for an elective TOTAL HIP ARTHROPLASTY on 08/21/2022. She is scheduled to receive CEFAZOLIN pre-operatively. Surgical PCR (+) for MRSA; see above.  PLANS:  Review renal function. Estimated Creatinine Clearance: 77.4 mL/min (by C-G formula based on SCr of 0.79 mg/dL). Review allergies. No documented allergy to vancomycin. Order added for VANCOMYCIN 1 GRAM IV to current preoperative prophylactic regimen.  Patient with orders for both CEFAZOLIN + VANCOMYCIN to be given in the setting of documented MRSA (+) surgical PCR.   Guidelines suggest that a beta-lactam antibiotic (first or second generation cephalosporin) should be added for activity against gram-negative organisms.  Vancomycin appears to be less effective than cefazolin for preventing SSIs caused by MSSA. For this reason, the use of vancomycin in combination with cefazolin is favored for prevention of SSI due to MRSA and coagulase-negative staphylococci.  Patient will received Profend on the day of surgery. Additionally, for further decolonization, Rx sent in as follows (per standard surgeon specific POC):  Meds ordered this encounter  Medications   mupirocin ointment (BACTROBAN) 2 %    Sig: Apply small about inside of both nostrils TWICE a day for the next 5 days.    Dispense:  15 g    Refill:  0   Notify primary attending surgeon of (+) MRSA result and that the above care measures have been implemented by the perioperative APP.   Encounter Diagnoses  Name Primary?   Pre-operative laboratory  examination Yes   MRSA nasal colonization    Honor Loh, MSN, APRN, FNP-C, CEN [facility]  Peri-operative Services Nurse Practitioner Phone: [phone] 08/14/22 5:32 PM

## 2022-08-18 NOTE — H&P (Signed)
History of Present Illness: Lindsey Woods is an 66 y.o. female seen for a new patient visit for left hip complaints.  The symptoms started 1 year ago.The symptoms are gradual in onset.   She reports no new injury or accident.  She reports 8/10 pain.  The pain is described as aching and sharp and it is located groin and lateral.   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  She has been doing conservative management for her left hip pain and feels that she is getting diminishing returns on use of anti-inflammatories rest and ice and is ready to proceed with a left total hip replacement. the symptoms are aggravated by activity and getting in and out of cars or chairs.  The pain is improved with acetaminophen, OTC NSAIDs, and rest.  moderate symptoms at night time.  She reports her lumbosacral symptoms have improved with injections she has received in her back recently yet she continues to have pain in her hip.    Past Medical History:     Past Medical History:  Diagnosis Date   Allergic state 1981    cats, manageable, pollens increasingly problematic with age   Asthma without status asthmaticus 1981    see above, also obesity made worse, gone since 2009-03-07   Colon polyps     Depression     GERD (gastroesophageal reflux disease)        Past Surgical History:      Past Surgical History:  Procedure Laterality Date   ARTHROPLASTY HIP TOTAL Right 02/21/2022    Dr. Rudene Christians, anterior approach   CESAREAN SECTION       COLONOSCOPY       LAPAROSCOPIC GASTROPLASTY NON-VERTICAL BANDING FOR OBESITY          Past Family History: Family History       Family History  Problem Relation Age of Onset   Parkinsonism Mother     Obesity Mother          deceased 1992/deceased 29   Stroke Mother      Other Mother          Parkinson's/Parkinson's Disease   Lung cancer Father     Coronary Artery Disease (Blocked arteries around heart) Father     Obesity Father          deceased 1984/Deceased 03/08/83   Colon cancer Maternal Grandmother     Obesity Brother          deceased 38   Skin cancer Brother          several squamous and basal cells removed   Skin cancer Brother          several squamous cell lesions removed   Depression Sister     Skin cancer Sister     Breast cancer Neg Hx     Diabetes type II Neg Hx     Hyperlipidemia (Elevated cholesterol) Neg Hx     Thyroid disease Neg Hx     Colon polyps Neg Hx         Medications:       Current Outpatient Medications Ordered in Epic  Medication Sig Dispense Refill   clobetasoL (TEMOVATE) 0.05 % cream once as needed  acetaminophen (TYLENOL) 325 MG tablet Take 650 mg by mouth in the morning, at noon, and at bedtime.       albuterol 90 mcg/actuation inhaler Inhale 2 inhalations into the lungs every 6 (six) hours as needed for Wheezing 16 g 2   cetirizine (ZYRTEC) 10 MG tablet Take 10 mg by mouth once daily       cholecalciferol (VITAMIN D3) 1000 unit tablet Take by mouth       cyanocobalamin (VITAMIN B12) 1000 MCG tablet Take 1,000 mcg by mouth once daily       diphenhydrAMINE (BENADRYL) 25 mg capsule Take by mouth       DULoxetine (CYMBALTA) 60 MG DR capsule Take 1 capsule (60 mg total) by mouth once daily 90 capsule 3   EPINEPHrine (EPIPEN) 0.3 mg/0.3 mL auto-injector         gabapentin (NEURONTIN) 300 MG capsule TAKE ONE CAPSULE BY MOUTH THREE TIMES A DAY 90 capsule 2   ibuprofen (MOTRIN) 200 MG tablet Take 400 mg by mouth as needed for Pain       naproxen sodium (ALEVE) 220 MG tablet Take 220 mg by mouth once as needed for Pain       omeprazole (PRILOSEC) 20 MG DR capsule TAKE ONE CAPSULE BY MOUTH TWICE A DAY BEFORE MEALS 180 capsule 2    No current Epic-ordered facility-administered medications on file.       Allergies: No Known Allergies    Review of Systems:  A comprehensive 14 point ROS was performed, reviewed, and the pertinent orthopaedic findings are documented in the HPI.   Physical Exam: Body mass index is 33.21 kg/m. General/Constitutional: No apparent distress: well-nourished and well developed. EXAM; HEENT [id number] Lymphatic: No palpable adenopathy. Respiratory: Non-labored breathing Vascular: No edema, swelling or tenderness, except as noted in detailed exam. Integumentary: No impressive skin lesions present, except as noted in detailed exam. Neuro/Psych: Normal mood and affect, oriented to person, place and time. Musculoskeletal: Normal, except as noted in detailed exam and in HPI.   Bilateral  hip exam   SKIN: intact over left hip well-healed incision over anterior right hip SWELLING: none WARMTH: no warmth TENDERNESS: none, Stinchfield Positive left ROM: 10 degrees internal rotation right side 5 degrees internal rotation left side and 30 degrees external rotation right side 20 degrees external rotation left side and pain with internal rotation of the left hip STRENGTH: normal and equal bilaterally GAIT: stiff-legged STABILITY: stable to testing CREPITUS: no LEG LENGTH DISCREPANCY: right longer by 1/2 cm NEUROLOGICAL EXAM: normal and normal sensation and reflexes, able to dorsiflex bilaterally, some existing altered sensation over right lateral thigh s/p R THA. VASCULAR EXAM: normal and pulses present LUMBAR SPINE:        tenderness: no                                     straight leg raising sign: negative                                     motor exam: normal   The contralateral hip was examined for comparison and it showed: TENDERNESS: none ROM: normal and full STRENGTH: normal STABILITY: stable to testing   Pulmonary exam: Lungs clear to auscultation bilaterally no wheezing rales or rhonchi Cardiac exam: Regular rate and rhythm no obvious murmurs rubs  or  gallops.    Hip Imaging :  AP frog-leg lateral and pelvis views of the left hip show moderate/severe arthritic changes with joint space narrowing, osteophyte formation, sclerosis, and subchondral cystic formation.  Additionally noted is lumbosacral spine arthritic changes with osteophyte formation and joint space narrowing.    Assessment:      Encounter Diagnosis  Name Primary?   Primary osteoarthritis of left hip Yes        Plan: I think that she most likely has severe hip DJD with decreased range of motion on exam. The findings were discussed with the patient.  Patient educational materials were provided today regarding the diagnosis and treatment options. I discussed different treatment options. At this time she has left hip bone on bone arthritis. Based upon the patient's continued symptoms and failure to respond to conservative treatment, I have recommended a left total hip replacement for this patient. A long discussion took place with the patient describing what a total joint replacement is and what the procedure would entail. A hip model, similar to the implants that will be used during the operation, was utilized to demonstrate the implants. Choices of implant manufactures were discussed and reviewed. The ability to secure the implant utilizing cement or cementless (press fit) fixation was discussed. Anterior and posterior exposures were discussed. For this patient an appropriate approach will be anterior.   The hospitalization and post-operative care and rehabilitation were also discussed. The use of perioperative antibiotics and DVT prophylaxis were discussed. The risk, benefits and alternatives to a surgical intervention were discussed at length with the patient. The patient was also advised of risks related to the medical comorbidities and elevated body mass index (BMI). A lengthy discussion took place to review the most common complications including but not limited to: deep vein  thrombosis, pulmonary embolus, heart attack, stroke, infection, wound breakdown, numbness, leg length in-equality, damage to nerves, tendon,muscles, arteries or other blood vessels, death and other possible complications from anesthesia. The patient was told that we will take steps to minimize these risks by using sterile technique, antibiotics and DVT prophylaxis when appropriate and follow the patient postoperatively in the office setting to monitor progress. The possibility of recurrent pain, no improvement in pain and actual worsening of pain were also discussed with the patient. The risk of dislocation following total hip replacement was discussed and potential precautions to prevent dislocation were reviewed.    The discharge plan of care focused on the patient going home following surgery. The patient was encouraged to make the necessary arrangements to have someone stay with them when they are discharged home.    The benefits of surgery were discussed with the patient including the potential for improving the patient's current clinical condition through operative intervention. Alternatives to surgical intervention including continued conservative management were also discussed in detail. All questions were answered to the satisfaction of the patient. The patient participated and agreed to the plan of care as well as the use of the recommended implants for their total hip replacement surgery.         Patient will need medical clearance for the surgery.     Other hip has "IMPLANTS: Medacta Masterloc 6 LAT stem with ceramic S 28 mm head, 52 mm Mpact DM cup and liner"    Steffanie Rainwater, MD [facility] [place], [place] [address] Phone: [phone]  History and physical exam repeated on day of surgery, no changes in status.

## 2022-08-18 NOTE — Anesthesia Preprocedure Evaluation (Signed)
Anesthesia Evaluation  Patient identified by MRN, date of birth, ID band Patient awake    Reviewed: Allergy & Precautions, NPO status , Patient's Chart, lab work & pertinent test results  History of Anesthesia Complications Negative for: history of anesthetic complications  Airway Mallampati: I   Neck ROM: Full    Dental  (+) Missing, Chipped   Pulmonary asthma ,    Pulmonary exam normal breath sounds clear to auscultation       Cardiovascular Exercise Tolerance: Good Normal cardiovascular exam Rhythm:Regular Rate:Normal  ECG 02/09/22: normal   Neuro/Psych  Headaches, PSYCHIATRIC DISORDERS Depression Chronic lower back pain    GI/Hepatic GERD  ,S/p gastric band and removal   Endo/Other  Obesity   Renal/GU negative Renal ROS     Musculoskeletal  (+) Arthritis ,   Abdominal   Peds  Hematology  (+) Blood dyscrasia, anemia ,   Anesthesia Other Findings   Reproductive/Obstetrics                             Anesthesia Physical  Anesthesia Plan  ASA: 2  Anesthesia Plan: General and Spinal   Post-op Pain Management:    Induction: Intravenous  PONV Risk Score and Plan: 3 and Propofol infusion, TIVA, Treatment may vary due to age or medical condition and Ondansetron  Airway Management Planned: Natural Airway and Nasal Cannula  Additional Equipment:   Intra-op Plan:   Post-operative Plan:   Informed Consent:   Plan Discussed with: CRNA  Anesthesia Plan Comments: (Plan for spinal and GA with natural airway, LMA/GETA backup.  Patient consented for risks of anesthesia including but not limited to:  - adverse reactions to medications - damage to eyes, teeth, lips or other oral mucosa - nerve damage due to positioning  - sore throat or hoarseness - headache, bleeding, infection, nerve damage 2/2 spinal - damage to heart, brain, nerves, lungs, other parts of body or loss of  life  Informed patient about role of CRNA in peri- and intra-operative care.  Patient voiced understanding.)        Anesthesia Quick Evaluation

## 2022-08-20 MED ORDER — LACTATED RINGERS IV SOLN: INTRAVENOUS | Status: DC

## 2022-08-20 MED ORDER — CHLORHEXIDINE GLUCONATE 0.12 % MT SOLN: 15.0000 mL | Freq: Once | OROMUCOSAL | Status: AC

## 2022-08-20 MED ORDER — ORAL CARE MOUTH RINSE: 15.0000 mL | Freq: Once | OROMUCOSAL | Status: AC

## 2022-08-20 MED ORDER — CEFAZOLIN SODIUM-DEXTROSE 2-4 GM/100ML-% IV SOLN
2.0000 g | INTRAVENOUS | Status: AC
  Administered 2022-08-21: 2 g via INTRAVENOUS

## 2022-08-20 MED ORDER — VANCOMYCIN HCL IN DEXTROSE 1-5 GM/200ML-% IV SOLN: 1000.0000 mg | Freq: Once | INTRAVENOUS | Status: AC

## 2022-08-21 ENCOUNTER — Ambulatory Visit: Payer: BC Managed Care – PPO

## 2022-08-21 ENCOUNTER — Encounter
Admission: RE | Disposition: A | Discharge status: Home / Self Care | Payer: Self-pay | Source: Home / Self Care | Attending: Orthopedic Surgery

## 2022-08-21 ENCOUNTER — Ambulatory Visit: Payer: BC Managed Care – PPO | Admitting: Urgent Care

## 2022-08-21 ENCOUNTER — Observation Stay
Admission: RE | Admit: 2022-08-21 | Discharge: 2022-08-22 | Disposition: A | Discharge status: Home / Self Care | Payer: BC Managed Care – PPO | Attending: Orthopedic Surgery | Admitting: Orthopedic Surgery

## 2022-08-21 ENCOUNTER — Encounter: Payer: Self-pay | Admitting: *Deleted

## 2022-08-21 ENCOUNTER — Other Ambulatory Visit: Payer: Self-pay

## 2022-08-21 DIAGNOSIS — M1612 Unilateral primary osteoarthritis, left hip: Principal | ICD-10-CM | POA: Insufficient documentation

## 2022-08-21 DIAGNOSIS — Z96641 Presence of right artificial hip joint: Secondary | ICD-10-CM | POA: Insufficient documentation

## 2022-08-21 DIAGNOSIS — Z79899 Other long term (current) drug therapy: Secondary | ICD-10-CM | POA: Insufficient documentation

## 2022-08-21 DIAGNOSIS — J45909 Unspecified asthma, uncomplicated: Secondary | ICD-10-CM | POA: Insufficient documentation

## 2022-08-21 DIAGNOSIS — Z01812 Encounter for preprocedural laboratory examination: Secondary | ICD-10-CM

## 2022-08-21 HISTORY — PX: TOTAL HIP ARTHROPLASTY: SHX124

## 2022-08-21 SURGERY — ARTHROPLASTY, HIP, TOTAL, ANTERIOR APPROACH
Anesthesia: General | Site: Hip | Laterality: Left

## 2022-08-21 MED ORDER — ENOXAPARIN SODIUM 40 MG/0.4ML IJ SOSY
40.0000 mg | PREFILLED_SYRINGE | INTRAMUSCULAR | Status: DC
  Administered 2022-08-22: 40 mg via SUBCUTANEOUS
  Filled 2022-08-21: qty 0.4

## 2022-08-21 MED ORDER — PANTOPRAZOLE SODIUM 40 MG PO TBEC
40.0000 mg | DELAYED_RELEASE_TABLET | Freq: Every day | ORAL | Status: DC
  Administered 2022-08-21 – 2022-08-22 (×2): 40 mg via ORAL
  Filled 2022-08-21 (×2): qty 1

## 2022-08-21 MED ORDER — METOCLOPRAMIDE HCL 5 MG/ML IJ SOLN: 5.0000 mg | Freq: Three times a day (TID) | INTRAMUSCULAR | Status: DC | PRN

## 2022-08-21 MED ORDER — PROPOFOL 1000 MG/100ML IV EMUL
INTRAVENOUS | Status: AC
  Filled 2022-08-21: qty 100

## 2022-08-21 MED ORDER — OXYCODONE HCL 5 MG/5ML PO SOLN: 5.0000 mg | Freq: Once | ORAL | Status: DC | PRN

## 2022-08-21 MED ORDER — MENTHOL 3 MG MT LOZG: 1.0000 | LOZENGE | OROMUCOSAL | Status: DC | PRN

## 2022-08-21 MED ORDER — SODIUM CHLORIDE (PF) 0.9 % IJ SOLN
INTRAMUSCULAR | Status: DC | PRN
  Administered 2022-08-21: 70 mL

## 2022-08-21 MED ORDER — EPHEDRINE 5 MG/ML INJ
INTRAVENOUS | Status: AC
  Filled 2022-08-21: qty 5

## 2022-08-21 MED ORDER — DOCUSATE SODIUM 100 MG PO CAPS
100.0000 mg | ORAL_CAPSULE | Freq: Two times a day (BID) | ORAL | Status: DC
  Administered 2022-08-21 – 2022-08-22 (×2): 100 mg via ORAL
  Filled 2022-08-21 (×2): qty 1

## 2022-08-21 MED ORDER — BUPIVACAINE LIPOSOME 1.3 % IJ SUSP
INTRAMUSCULAR | Status: AC
  Filled 2022-08-21: qty 20

## 2022-08-21 MED ORDER — BUPIVACAINE HCL (PF) 0.5 % IJ SOLN
INTRAMUSCULAR | Status: AC
  Filled 2022-08-21: qty 10

## 2022-08-21 MED ORDER — ONDANSETRON HCL 4 MG/2ML IJ SOLN
INTRAMUSCULAR | Status: AC
  Filled 2022-08-21: qty 2

## 2022-08-21 MED ORDER — ONDANSETRON HCL 4 MG/2ML IJ SOLN
INTRAMUSCULAR | Status: DC | PRN
  Administered 2022-08-21: 4 mg via INTRAVENOUS

## 2022-08-21 MED ORDER — PROMETHAZINE HCL 25 MG/ML IJ SOLN: 6.2500 mg | INTRAMUSCULAR | Status: DC | PRN

## 2022-08-21 MED ORDER — SODIUM CHLORIDE FLUSH 0.9 % IV SOLN
INTRAVENOUS | Status: AC
  Filled 2022-08-21: qty 20

## 2022-08-21 MED ORDER — LIDOCAINE HCL (PF) 2 % IJ SOLN
INTRAMUSCULAR | Status: AC
  Filled 2022-08-21: qty 5

## 2022-08-21 MED ORDER — VANCOMYCIN HCL IN DEXTROSE 1-5 GM/200ML-% IV SOLN
INTRAVENOUS | Status: AC
  Administered 2022-08-21: 1000 mg via INTRAVENOUS
  Filled 2022-08-21: qty 200

## 2022-08-21 MED ORDER — CEFAZOLIN SODIUM-DEXTROSE 2-4 GM/100ML-% IV SOLN
2.0000 g | Freq: Four times a day (QID) | INTRAVENOUS | Status: AC
  Administered 2022-08-21 (×2): 2 g via INTRAVENOUS
  Filled 2022-08-21 (×2): qty 100

## 2022-08-21 MED ORDER — TRAMADOL HCL 50 MG PO TABS
50.0000 mg | ORAL_TABLET | Freq: Four times a day (QID) | ORAL | Status: DC | PRN
  Administered 2022-08-21: 50 mg via ORAL
  Filled 2022-08-21: qty 1

## 2022-08-21 MED ORDER — ALBUTEROL SULFATE (2.5 MG/3ML) 0.083% IN NEBU: 3.0000 mL | INHALATION_SOLUTION | Freq: Four times a day (QID) | RESPIRATORY_TRACT | Status: DC | PRN

## 2022-08-21 MED ORDER — CHLORHEXIDINE GLUCONATE 0.12 % MT SOLN
OROMUCOSAL | Status: AC
  Administered 2022-08-21: 15 mL via OROMUCOSAL
  Filled 2022-08-21: qty 15

## 2022-08-21 MED ORDER — TRANEXAMIC ACID-NACL 1000-0.7 MG/100ML-% IV SOLN
INTRAVENOUS | Status: DC | PRN
  Administered 2022-08-21 (×2): 1000 mg via INTRAVENOUS

## 2022-08-21 MED ORDER — ORAL CARE MOUTH RINSE: 15.0000 mL | OROMUCOSAL | Status: DC | PRN

## 2022-08-21 MED ORDER — PHENYLEPHRINE HCL (PRESSORS) 10 MG/ML IV SOLN
INTRAVENOUS | Status: AC
  Filled 2022-08-21: qty 1

## 2022-08-21 MED ORDER — ACETAMINOPHEN 10 MG/ML IV SOLN
INTRAVENOUS | Status: AC
  Filled 2022-08-21: qty 100

## 2022-08-21 MED ORDER — DULOXETINE HCL 30 MG PO CPEP
60.0000 mg | ORAL_CAPSULE | Freq: Every morning | ORAL | Status: DC
  Administered 2022-08-22: 60 mg via ORAL
  Filled 2022-08-21: qty 2

## 2022-08-21 MED ORDER — ONDANSETRON HCL 4 MG/2ML IJ SOLN: 4.0000 mg | Freq: Four times a day (QID) | INTRAMUSCULAR | Status: DC | PRN

## 2022-08-21 MED ORDER — DEXAMETHASONE SODIUM PHOSPHATE 10 MG/ML IJ SOLN
INTRAMUSCULAR | Status: AC
  Filled 2022-08-21: qty 1

## 2022-08-21 MED ORDER — 0.9 % SODIUM CHLORIDE (POUR BTL) OPTIME
TOPICAL | Status: DC | PRN
  Administered 2022-08-21: 500 mL

## 2022-08-21 MED ORDER — PROPOFOL 10 MG/ML IV BOLUS
INTRAVENOUS | Status: DC | PRN
  Administered 2022-08-21: 50 mg via INTRAVENOUS
  Administered 2022-08-21: 80 ug/kg/min via INTRAVENOUS

## 2022-08-21 MED ORDER — LIDOCAINE HCL (CARDIAC) PF 100 MG/5ML IV SOSY
PREFILLED_SYRINGE | INTRAVENOUS | Status: DC | PRN
  Administered 2022-08-21: 100 mg via INTRAVENOUS

## 2022-08-21 MED ORDER — THROMBIN 5000 UNITS EX SOLR
CUTANEOUS | Status: AC
  Filled 2022-08-21: qty 5000

## 2022-08-21 MED ORDER — OXYCODONE HCL 5 MG PO TABS: 5.0000 mg | ORAL_TABLET | Freq: Once | ORAL | Status: DC | PRN

## 2022-08-21 MED ORDER — GABAPENTIN 300 MG PO CAPS
600.0000 mg | ORAL_CAPSULE | Freq: Every day | ORAL | Status: DC
  Administered 2022-08-21: 600 mg via ORAL
  Filled 2022-08-21: qty 2

## 2022-08-21 MED ORDER — HYDROCODONE-ACETAMINOPHEN 5-325 MG PO TABS
1.0000 | ORAL_TABLET | ORAL | Status: DC | PRN
  Administered 2022-08-21: 2 via ORAL
  Filled 2022-08-21: qty 2

## 2022-08-21 MED ORDER — MORPHINE SULFATE (PF) 2 MG/ML IV SOLN: 0.5000 mg | INTRAVENOUS | Status: DC | PRN

## 2022-08-21 MED ORDER — EPHEDRINE SULFATE (PRESSORS) 50 MG/ML IJ SOLN
INTRAMUSCULAR | Status: DC | PRN
  Administered 2022-08-21: 5 mg via INTRAVENOUS

## 2022-08-21 MED ORDER — ACETAMINOPHEN 500 MG PO TABS
1000.0000 mg | ORAL_TABLET | Freq: Three times a day (TID) | ORAL | Status: DC
  Administered 2022-08-21 – 2022-08-22 (×3): 1000 mg via ORAL
  Filled 2022-08-21 (×3): qty 2

## 2022-08-21 MED ORDER — BUPIVACAINE-EPINEPHRINE (PF) 0.25% -1:200000 IJ SOLN
INTRAMUSCULAR | Status: AC
  Filled 2022-08-21: qty 30

## 2022-08-21 MED ORDER — CEFAZOLIN SODIUM-DEXTROSE 2-4 GM/100ML-% IV SOLN
INTRAVENOUS | Status: AC
  Filled 2022-08-21: qty 100

## 2022-08-21 MED ORDER — SURGIPHOR WOUND IRRIGATION SYSTEM - OPTIME
TOPICAL | Status: DC | PRN
  Administered 2022-08-21: 450 mL via TOPICAL

## 2022-08-21 MED ORDER — BUPIVACAINE HCL (PF) 0.25 % IJ SOLN
INTRAMUSCULAR | Status: AC
  Filled 2022-08-21: qty 30

## 2022-08-21 MED ORDER — DROPERIDOL 2.5 MG/ML IJ SOLN: 0.6250 mg | Freq: Once | INTRAMUSCULAR | Status: DC | PRN

## 2022-08-21 MED ORDER — FENTANYL CITRATE (PF) 100 MCG/2ML IJ SOLN: 25.0000 ug | INTRAMUSCULAR | Status: DC | PRN

## 2022-08-21 MED ORDER — METOCLOPRAMIDE HCL 5 MG PO TABS: 5.0000 mg | ORAL_TABLET | Freq: Three times a day (TID) | ORAL | Status: DC | PRN

## 2022-08-21 MED ORDER — ONDANSETRON HCL 4 MG PO TABS: 4.0000 mg | ORAL_TABLET | Freq: Four times a day (QID) | ORAL | Status: DC | PRN

## 2022-08-21 MED ORDER — ACETAMINOPHEN 10 MG/ML IV SOLN
INTRAVENOUS | Status: DC | PRN
  Administered 2022-08-21: 1000 mg via INTRAVENOUS

## 2022-08-21 MED ORDER — PHENYLEPHRINE 80 MCG/ML (10ML) SYRINGE FOR IV PUSH (FOR BLOOD PRESSURE SUPPORT)
PREFILLED_SYRINGE | INTRAVENOUS | Status: AC
  Filled 2022-08-21: qty 10

## 2022-08-21 MED ORDER — BUPIVACAINE HCL (PF) 0.5 % IJ SOLN
INTRAMUSCULAR | Status: DC | PRN
  Administered 2022-08-21: 3 mL via INTRATHECAL

## 2022-08-21 MED ORDER — LORATADINE 10 MG PO TABS
10.0000 mg | ORAL_TABLET | Freq: Every day | ORAL | Status: DC
  Filled 2022-08-21: qty 1

## 2022-08-21 MED ORDER — MIDAZOLAM HCL 5 MG/5ML IJ SOLN
INTRAMUSCULAR | Status: DC | PRN
  Administered 2022-08-21: 2 mg via INTRAVENOUS

## 2022-08-21 MED ORDER — PHENOL 1.4 % MT LIQD: 1.0000 | OROMUCOSAL | Status: DC | PRN

## 2022-08-21 MED ORDER — PHENYLEPHRINE HCL (PRESSORS) 10 MG/ML IV SOLN
INTRAVENOUS | Status: DC | PRN
  Administered 2022-08-21 (×4): 80 ug via INTRAVENOUS

## 2022-08-21 MED ORDER — ACETAMINOPHEN 10 MG/ML IV SOLN: 1000.0000 mg | Freq: Once | INTRAVENOUS | Status: DC | PRN

## 2022-08-21 MED ORDER — DEXAMETHASONE SODIUM PHOSPHATE 10 MG/ML IJ SOLN
INTRAMUSCULAR | Status: DC | PRN
  Administered 2022-08-21: 10 mg via INTRAVENOUS

## 2022-08-21 MED ORDER — PHENYLEPHRINE HCL-NACL 20-0.9 MG/250ML-% IV SOLN
INTRAVENOUS | Status: DC | PRN
  Administered 2022-08-21: 40 ug/min via INTRAVENOUS

## 2022-08-21 MED ORDER — MIDAZOLAM HCL 2 MG/2ML IJ SOLN
INTRAMUSCULAR | Status: AC
  Filled 2022-08-21: qty 2

## 2022-08-21 SURGICAL SUPPLY — 65 items
BLADE SAGITTAL AGGR TOOTH XLG (BLADE) ×1 IMPLANT
BNDG COHESIVE 6X5 TAN ST LF (GAUZE/BANDAGES/DRESSINGS) ×1 IMPLANT
CHLORAPREP W/TINT 26 (MISCELLANEOUS) ×2 IMPLANT
COVER BACK TABLE REUSABLE LG (DRAPES) ×1 IMPLANT
DERMABOND ADVANCED .7 DNX12 (GAUZE/BANDAGES/DRESSINGS) ×1 IMPLANT
DRAPE 3/4 80X56 (DRAPES) ×2 IMPLANT
DRAPE C-ARM XRAY 36X54 (DRAPES) ×1 IMPLANT
DRAPE INCISE 23X17 IOBAN STRL (DRAPES) ×1
DRAPE INCISE 23X17 STRL (DRAPES) ×1 IMPLANT
DRAPE INCISE IOBAN 23X17 STRL (DRAPES) ×1 IMPLANT
DRAPE INCISE IOBAN 66X60 STRL (DRAPES) IMPLANT
DRAPE POUCH INSTRU U-SHP 10X18 (DRAPES) ×1 IMPLANT
DRSG MEPILEX SACRM 8.7X9.8 (GAUZE/BANDAGES/DRESSINGS) ×1 IMPLANT
ELECT REM PT RETURN 9FT ADLT (ELECTROSURGICAL) ×1
ELECTRODE REM PT RTRN 9FT ADLT (ELECTROSURGICAL) ×1 IMPLANT
GLOVE PROTEXIS LATEX SZ 7.5 (GLOVE) ×3 IMPLANT
GLOVE SURG LATEX 7.5 PF (GLOVE) ×3 IMPLANT
GOWN STRL REUS W/ TWL LRG LVL3 (GOWN DISPOSABLE) ×1 IMPLANT
GOWN STRL REUS W/ TWL XL LVL3 (GOWN DISPOSABLE) ×1 IMPLANT
GOWN STRL REUS W/TWL LRG LVL3 (GOWN DISPOSABLE) ×1
GOWN STRL REUS W/TWL XL LVL3 (GOWN DISPOSABLE) ×1
HEAD CERAMIC V40 BIOLOX DEL 28 (Orthopedic Implant) IMPLANT
HOOD PEEL AWAY FLYTE STAYCOOL (MISCELLANEOUS) ×3 IMPLANT
LIGHT WAVEGUIDE WIDE FLAT (MISCELLANEOUS) ×1 IMPLANT
LINER 42MM E (Orthopedic Implant) IMPLANT
LINER ADM MDM INS 28/48 42E (Liner) IMPLANT
MANIFOLD NEPTUNE II (INSTRUMENTS) ×1 IMPLANT
MAT ABSORB  FLUID 56X50 GRAY (MISCELLANEOUS) ×1
MAT ABSORB FLUID 56X50 GRAY (MISCELLANEOUS) ×1 IMPLANT
NDL SPNL 20GX3.5 QUINCKE YW (NEEDLE) ×1 IMPLANT
NEEDLE SPNL 20GX3.5 QUINCKE YW (NEEDLE) ×1 IMPLANT
NS IRRIG 1000ML POUR BTL (IV SOLUTION) ×1 IMPLANT
PACK HIP COMPR (MISCELLANEOUS) ×1 IMPLANT
PAD ABD DERMACEA PRESS 5X9 (GAUZE/BANDAGES/DRESSINGS) ×4 IMPLANT
PENCIL SMOKE EVACUATOR COATED (MISCELLANEOUS) ×1 IMPLANT
SCALPEL PROTECTED #10 DISP (BLADE) ×1 IMPLANT
SCREW HEX LP 6.5X30 (Screw) IMPLANT
SHELL CLUSTERHOLE ACETABULAR 5 (Shell) IMPLANT
SLEEVE SCD COMPRESS KNEE MED (STOCKING) ×1 IMPLANT
SOLUTION IRRIG SURGIPHOR (IV SOLUTION) ×1 IMPLANT
STEM HIGH OFFSET SZ3 36X101 (Stem) IMPLANT
SURGIFLO W/THROMBIN 8M KIT (HEMOSTASIS) IMPLANT
SUT BONE WAX W31G (SUTURE) ×1 IMPLANT
SUT DVC 2 QUILL PDO  T11 36X36 (SUTURE)
SUT DVC 2 QUILL PDO T11 36X36 (SUTURE) ×1 IMPLANT
SUT DVC VLOC 3-0 CL 6 P-12 (SUTURE) ×1 IMPLANT
SUT DVC VLOC 90 3-0 CV23 UNDY (SUTURE) IMPLANT
SUT ETHIBOND 2 V 37 (SUTURE) ×1 IMPLANT
SUT QUILL PDO 2 24X24 VLT (SUTURE) ×1 IMPLANT
SUT SILK 0 (SUTURE)
SUT SILK 0 30XBRD TIE 6 (SUTURE) ×1 IMPLANT
SUT V-LOC 90 ABS DVC 3-0 CL (SUTURE) ×1 IMPLANT
SUT VIC AB 0 CT1 36 (SUTURE) ×1 IMPLANT
SUT VIC AB 1 CT1 18XCR BRD 8 (SUTURE) ×1 IMPLANT
SUT VIC AB 1 CT1 36 (SUTURE) ×1 IMPLANT
SUT VIC AB 1 CT1 8-18 (SUTURE)
SUT VIC AB 2-0 CT2 27 (SUTURE) ×1 IMPLANT
SYR 20ML LL LF (SYRINGE) ×2 IMPLANT
SYR BULB IRRIG 60ML STRL (SYRINGE) ×1 IMPLANT
TAPE MICROFOAM 4IN (TAPE) IMPLANT
TOWEL OR 17X26 4PK STRL BLUE (TOWEL DISPOSABLE) ×1 IMPLANT
TRAP FLUID SMOKE EVACUATOR (MISCELLANEOUS) ×1 IMPLANT
TUBE KAMVAC SUCTION (TUBING) ×1 IMPLANT
WAND WEREWOLF FASTSEAL 6.0 (MISCELLANEOUS) ×1 IMPLANT
WATER STERILE IRR 1000ML POUR (IV SOLUTION) ×1 IMPLANT

## 2022-08-21 NOTE — Anesthesia Procedure Notes (Signed)
Spinal  Patient location during procedure: OR Start time: 08/21/2022 7:38 AM End time: 08/21/2022 7:38 AM Reason for block: surgical anesthesia Staffing Performed: resident/CRNA  Anesthesiologist: Iran Ouch, MD Resident/CRNA: Cammie Sickle, CRNA Performed by: Cammie Sickle, CRNA Authorized by: Cammie Sickle, CRNA   Preanesthetic Checklist Completed: patient identified, IV checked, site marked, risks and benefits discussed, surgical consent, monitors and equipment checked, pre-op evaluation and timeout performed Spinal Block Patient position: sitting Prep: DuraPrep Patient monitoring: heart rate, cardiac monitor, continuous pulse ox and blood pressure Approach: midline Location: L3-4 Injection technique: single-shot Needle Needle type: Sprotte  Needle gauge: 24 G Needle length: 9 cm Assessment Sensory level: T10 Events: CSF return

## 2022-08-21 NOTE — Evaluation (Signed)
Physical Therapy Evaluation Patient Details Name: Lindsey Woods MRN: [id number] DOB: May 20, 1956 Today's Date: 08/21/2022  History of Present Illness  Pt is a 66 yo female s/p L THA. PMH of asthma, GERD, R THA.  Clinical Impression  Patient alert, agreeable to PT, did not report any pain with mobility today, still with some unresolved nerve block. Stated at baseline she is independent and will have family to assist as needed.  She was able to move LLE against gravity, though still challenged by SLR. A few exercises performed throughout session, AAROM for heel slides/hip abduction. Bed mobility modI, good sitting balance noted. Sit <> stand with RW and CGA, and pt able to step pivot to [facility]. Pericare in standing, CGA. The pt also ambulated ~76ft to recliner in room, CGA, true ambulation deferred until more return of sensation.  Overall the patient demonstrated deficits (see "PT Problem List") that impede the patient's functional abilities, safety, and mobility and would benefit from skilled PT intervention. Recommendation is home and to follow physician's recommendations of HHPT/outpt PT to return pt to PLOF.        Recommendations for follow up therapy are one component of a multi-disciplinary discharge planning process, led by the attending physician.  Recommendations may be updated based on patient status, additional functional criteria and insurance authorization.  Follow Up Recommendations Follow physician's recommendations for discharge plan and follow up therapies      Assistance Recommended at Discharge Intermittent Supervision/Assistance  Patient can return home with the following  Assistance with cooking/housework;Assist for transportation;Help with stairs or ramp for entrance    Equipment Recommendations None recommended by PT  Recommendations for Other Services       Functional Status Assessment Patient has had a recent decline in their functional status and demonstrates the  ability to make significant improvements in function in a reasonable and predictable amount of time.     Precautions / Restrictions Precautions Precautions: Fall;Anterior Hip Precaution Booklet Issued: Yes (comment) Restrictions Weight Bearing Restrictions: Yes LLE Weight Bearing: Weight bearing as tolerated      Mobility  Bed Mobility Overal bed mobility: Modified Independent             General bed mobility comments: use of bed rails    Transfers Overall transfer level: Needs assistance Equipment used: Rolling walker (2 wheels) Transfers: Sit to/from Stand Sit to Stand: Min guard                Ambulation/Gait Ambulation/Gait assistance: Min guard Gait Distance (Feet): 5 Feet Assistive device: Rolling walker (2 wheels)         General Gait Details: able to take several steps from [facility] to chair in room, true ambulation deferred due to continued unresolved nerve block  Stairs            Wheelchair Mobility    Modified Rankin (Stroke Patients Only)       Balance Overall balance assessment: Needs assistance Sitting-balance support: Feet supported Sitting balance-Leahy Scale: Good     Standing balance support: Single extremity supported Standing balance-Leahy Scale: Good Standing balance comment: pericare in standing                             Pertinent Vitals/Pain Pain Assessment Pain Assessment: No/denies pain    Home Living Family/patient expects to be discharged to:: Private residence Living Arrangements: Spouse/significant other;Children Available Help at Discharge: Family Type of Home: House Home Access: Stairs to enter  Entrance Stairs-Rails: Right;Left Entrance Stairs-Number of Steps: 3   Home Layout: One level Home Equipment: Grab bars - tub/shower;Adaptive equipment;BSC/3in1;Rolling Walker (2 wheels);Cane - single point;Cane - quad      Prior Function Prior Level of Function : Independent/Modified Independent                      Hand Dominance   Dominant Hand: Right    Extremity/Trunk Assessment   Upper Extremity Assessment Upper Extremity Assessment: Overall WFL for tasks assessed    Lower Extremity Assessment Lower Extremity Assessment: RLE deficits/detail;LLE deficits/detail RLE Deficits / Details: WFLs LLE Deficits / Details: unable to SLR fully, but quad set and ankle PF/DF appropriate       Communication   Communication: No difficulties  Cognition Arousal/Alertness: Awake/alert Behavior During Therapy: WFL for tasks assessed/performed Overall Cognitive Status: Within Functional Limits for tasks assessed                                          General Comments      Exercises Total Joint Exercises Ankle Circles/Pumps: AROM, Both, 5 reps Quad Sets: AROM, Left, 5 reps, Strengthening Heel Slides: AAROM, Strengthening, Left, 5 reps   Assessment/Plan    PT Assessment Patient needs continued PT services  PT Problem List Decreased strength;Decreased mobility;Decreased range of motion;Decreased activity tolerance;Decreased balance;Pain;Decreased knowledge of use of DME;Decreased knowledge of precautions       PT Treatment Interventions DME instruction;Therapeutic exercise;Gait training;Balance training;Neuromuscular re-education;Stair training;Functional mobility training;Therapeutic activities;Patient/family education    PT Goals (Current goals can be found in the Care Plan section)  Acute Rehab PT Goals Patient Stated Goal: to get back to PLOF PT Goal Formulation: With patient Time For Goal Achievement: 09/04/22 Potential to Achieve Goals: Good    Frequency BID     Co-evaluation               AM-PAC PT "6 Clicks" Mobility  Outcome Measure Help needed turning from your back to your side while in a flat bed without using bedrails?: None Help needed moving from lying on your back to sitting on the side of a flat bed without using  bedrails?: None Help needed moving to and from a bed to a chair (including a wheelchair)?: None Help needed standing up from a chair using your arms (e.g., wheelchair or bedside chair)?: None Help needed to walk in hospital room?: A Little Help needed climbing 3-5 steps with a railing? : A Little 6 Click Score: 22    End of Session Equipment Utilized During Treatment: Gait belt Activity Tolerance: Patient tolerated treatment well Patient left: with chair alarm set;in chair;with call bell/phone within reach Nurse Communication: Mobility status PT Visit Diagnosis: Other abnormalities of gait and mobility (R26.89);Difficulty in walking, not elsewhere classified (R26.2);Muscle weakness (generalized) (M62.81);Pain Pain - Right/Left: Left Pain - part of body: Hip    Time: 2440-1027 PT Time Calculation (min) (ACUTE ONLY): 21 min   Charges:   PT Evaluation $PT Eval Low Complexity: 1 Low PT Treatments $Therapeutic Activity: 8-22 mins        Lieutenant Diego PT, DPT 2:42 PM,08/21/22

## 2022-08-21 NOTE — Anesthesia Postprocedure Evaluation (Signed)
Anesthesia Post Note  Patient: Lindsey Woods  Procedure(s) Performed: TOTAL HIP ARTHROPLASTY ANTERIOR APPROACH (Left: Hip)  Patient location during evaluation: PACU Anesthesia Type: Spinal Level of consciousness: awake and alert Pain management: pain level controlled Vital Signs Assessment: post-procedure vital signs reviewed and stable Respiratory status: spontaneous breathing, nonlabored ventilation and respiratory function stable Cardiovascular status: blood pressure returned to baseline and stable Postop Assessment: no apparent nausea or vomiting Anesthetic complications: no   No notable events documented.   Last Vitals:  Vitals:   08/21/22 1100 08/21/22 1122  BP: 121/68 120/61  Pulse: 62 62  Resp: 13 18  Temp: (!) 36.1 C 36.6 C  SpO2: 100% 100%    Last Pain:  Vitals:   08/21/22 1122  TempSrc:   PainSc: 0-No pain                 Iran Ouch

## 2022-08-21 NOTE — Transfer of Care (Signed)
Immediate Anesthesia Transfer of Care Note  Patient: Lindsey Woods  Procedure(s) Performed: TOTAL HIP ARTHROPLASTY ANTERIOR APPROACH (Left: Hip)  Patient Location: PACU  Anesthesia Type:Spinal  Level of Consciousness: awake  Airway & Oxygen Therapy: Patient Spontanous Breathing and Patient connected to face mask oxygen  Post-op Assessment: Report given to RN and Post -op Vital signs reviewed and stable  Post vital signs: Reviewed and stable  Last Vitals:  Vitals Value Taken Time  BP 123/71 08/21/22 1016  Temp 36.1 C 08/21/22 1016  Pulse 66 08/21/22 1021  Resp 16 08/21/22 1021  SpO2 100 % 08/21/22 1021  Vitals shown include unvalidated device data.  Last Pain:  Vitals:   08/21/22 1016  TempSrc:   PainSc: 0-No pain      Patients Stated Pain Goal: 0 (57/32/20 2542)  Complications: No notable events documented.

## 2022-08-21 NOTE — Anesthesia Procedure Notes (Signed)
Spinal  Patient location during procedure: OR Start time: 08/21/2022 7:32 AM End time: 08/21/2022 7:37 AM Reason for block: surgical anesthesia Staffing Performed: resident/CRNA  Resident/CRNA: Cammie Sickle, CRNA Performed by: Cammie Sickle, CRNA Authorized by: Iran Ouch, MD   Preanesthetic Checklist Completed: patient identified, IV checked, site marked, risks and benefits discussed, surgical consent, monitors and equipment checked, pre-op evaluation and timeout performed Spinal Block Patient position: sitting Prep: ChloraPrep Patient monitoring: heart rate, continuous pulse ox and blood pressure Approach: midline Location: L4-5 Injection technique: single-shot Needle Needle type: Introducer and Pencan  Needle gauge: 24 G Needle length: 10 cm Assessment Sensory level: T6 Events: CSF return Additional Notes Negative paresthesia. Negative blood return. Positive free-flowing CSF. Expiration date of kit checked and confirmed. Patient tolerated procedure well, without complications. Successful x1 attempt.

## 2022-08-21 NOTE — Op Note (Signed)
Patient Name: Lindsey Woods  MWN:[id number]  Pre-Operative Diagnosis: Left hip Osteoarthritis  Post-Operative Diagnosis: Left hip osteoarthritis  Procedure: Left Total Hip Arthroplasty  Components/Implants: Cup 52 trident    Liner: MDM shell liner 28/48 inner  Stem insignia 3 high offset  Head: 28 ceramic biolox v40   Date of Surgery: 08/21/2022  Surgeon: Reinaldo Berber MD  Assistant: Amador Cunas PA   Anesthesiologist: CRNA Judithann Sheen Pidana  Anesthesia: Spinal  EBL: 200cc  IVF: 500cc  Complications: None   Brief history: The patient is a 66 year old female with a history of osteoarthritis of the left hip with pain limiting their range of motion and activities of daily living, which has failed multiple attempts at conservative therapy.  The risks and benefits of total hip arthroplasty as definitive surgical treatment were discussed with the patient, who opted to proceed with the operation.  After outpatient medical clearance and optimization was completed the patient was admitted to [facility] for the procedure.  All preoperative films were reviewed and an appropriate surgical plan was made prior to surgery.   Description of procedure: The patient was brought to the operating room where laterality was confirmed by all those present to be the left side.  The patient was administered spinal anesthesia on a stretcher prior to being moved supine on the operating room table. Patient was given an intravenous dose of antibiotics for surgical prophylaxis and TXA.  All bony prominences and extremities were well padded and the patient was securely attached to the table boots, a perineal post was placed and the patient had a safety strap placed.  Surgical site was prepped with alcohol and chlorhexidine. The surgical site over the hip was and draped in typical sterile fashion with multiple layers of adhesive and nonadhesive drapes.  The incision site was marked out with a  sterile marker and care was taken to assess the position of the ASIS and ensure appropriate position for the incision.    A surgical timeout was then called with participation of all staff in the room the patient was then a confirmed again and laterality confirmed.  Incision was made over the anterior lateral aspect of the proximal thigh in line with the TFL.  Appropriate retractors were placed and all bleeding vessels were coagulated within the subcutaneous and fatty layers.  An incision was made in the TFL fascia in the interval was carefully identified.  The lateral ascending branches of the circumflex vessels were identified, cauterized and carefully dissected.  Retractors were placed around the superior lateral and inferior medial aspects of the femoral neck and a capsulotomy was performed exposing the hip joint.  Retraction stitches were placed and the capsulotomy to assist with visualization.  Femoral neck cut was then made and the femoral head was extracted after placing the leg in traction.  Bone wax was then applied to the proximal cut surface of the femur and aqua mantis was used to address any bleeding around the femoral neck cut.  Retractors were then placed around the acetabulum to fully visualize the joint space, and the remaining labral tissue was removed and pulvinar was removed.   The acetabulum was then sequentially reamed up to the appropriate size in order to get good fit and fill for the acetabular component while under fluoroscopic guidance.  Acetabular component was then placed and malleted into a secure fit while confirming position and abduction angle and anteversion utilizing fluoroscopy.  1 screw was then placed in the acetabular cup to assist  in securing the cup in place.  The real metal acetabular liner was then placed and checked to ensure full implantation.  The femur traction was dropped and sequentially externally rotated while performing a release of the posterior and  superomedial tissues off of the proximal femur to allow for mobility, care was taken to preserve the external rotators and piriformis attachments.  The remaining interval between the abductors and the capsule was dissected out and a retractor was placed over the superolateral aspect of the femur over the greater trochanter.  The leg was carefully brought down into extension and adducted to provide visualization of the proximal femur for broaching.  The femur was then sequentially broached up to an appropriate size which provided for good fill and stability to the femoral broach.  A trial neck and head were placed on the femoral broach and the leg was brought up for reduction.  The hip was reduced and manual check of stability was performed with the boot detached from the table.  The hip was found to be stable in flexion internal rotation and extension external rotation.  Leg lengths were confirmed on fluoroscopy utilizing a straight bar across the lesser trochanters.   The hip was then dislocated the trial neck and head were removed. The leg was then brought down into extension and adduction in the proximal femur was reexposed.  The broach trial was removed and the femur was irrigated with normal saline prior to the real femoral stem being implanted.  After the femoral stem was seated and shown to have good fit and fill the appropriate head was impacted the leg was brought up and reduced.  There was good range of motion with stability in flexion internal rotation and extension external rotation on testing.  Leg lengths were found to be appropriate on fluoroscopic evaluation at this time.  The hip was then irrigated with surgiphor betadine based solution and then saline solution.  The capsulotomy was repaired with Ethibond sutures.  A pericapsular and peritrochanteric cocktail with Exparel and bupivacaine was then injected as well as the subcutaneous tissues.  The fascia was closed with a #2 barbed running suture.   The deep tissues were closed with Vicryl sutures the subcutaneous tissues were closed with interrupted Vicryl sutures and a running barbed 3-0 suture.  The skin was then reinforced with Dermabond and a sterile dressing was placed.   The patient was awoken from anesthesia transferred off of the operating room table onto a hospital bed where examination of leg lengths found the leg lengths to be equal.  The patient was then transferred to the PACU in stable condition.

## 2022-08-21 NOTE — Interval H&P Note (Signed)
History and Physical Interval Note:  08/21/2022 7:19 AM  Lindsey Woods  has presented today for surgery, with the diagnosis of Primary osteoarthritis of left hip  M16.12.  The various methods of treatment have been discussed with the patient and family. After consideration of risks, benefits and other options for treatment, the patient has consented to  Procedure(s): TOTAL HIP ARTHROPLASTY ANTERIOR APPROACH (Left) as a surgical intervention.  The patient's history has been reviewed, patient examined, no change in status, stable for surgery.  I have reviewed the patient's chart and labs.  Questions were answered to the patient's satisfaction.     Steffanie Rainwater

## 2022-08-22 ENCOUNTER — Encounter: Payer: Self-pay | Admitting: Orthopedic Surgery

## 2022-08-22 DIAGNOSIS — M1612 Unilateral primary osteoarthritis, left hip: Secondary | ICD-10-CM | POA: Diagnosis not present

## 2022-08-22 LAB — COMPREHENSIVE METABOLIC PANEL
ALT: 15 U/L (ref 0–44)
AST: 29 U/L (ref 15–41)
Albumin: 3.3 g/dL — ABNORMAL LOW (ref 3.5–5.0)
Alkaline Phosphatase: 65 U/L (ref 38–126)
Anion gap: 6 (ref 5–15)
BUN: 17 mg/dL (ref 8–23)
CO2: 26 mmol/L (ref 22–32)
Calcium: 8.7 mg/dL — ABNORMAL LOW (ref 8.9–10.3)
Chloride: 107 mmol/L (ref 98–111)
Creatinine, Ser: 0.77 mg/dL (ref 0.44–1.00)
GFR, Estimated: >60 mL/min (ref >60)
Glucose, Bld: 155 mg/dL — ABNORMAL HIGH (ref 70–99)
Potassium: 4.2 mmol/L (ref 3.5–5.1)
Sodium: 139 mmol/L (ref 135–145)
Total Bilirubin: 0.7 mg/dL (ref 0.3–1.2)
Total Protein: 6.2 g/dL — ABNORMAL LOW (ref 6.5–8.1)

## 2022-08-22 LAB — CBC
HCT: 34.1 % — ABNORMAL LOW (ref 36.0–46.0)
Hemoglobin: 10.4 g/dL — ABNORMAL LOW (ref 12.0–15.0)
MCH: 23.9 pg — ABNORMAL LOW (ref 26.0–34.0)
MCHC: 30.5 g/dL (ref 30.0–36.0)
MCV: 78.4 fL — ABNORMAL LOW (ref 80.0–100.0)
Platelets: 230 10*3/uL (ref 150–400)
RBC: 4.35 MIL/uL (ref 3.87–5.11)
RDW: 17.2 % — ABNORMAL HIGH (ref 11.5–15.5)
WBC: 10.3 10*3/uL (ref 4.0–10.5)
nRBC: 0 % (ref 0.0–0.2)

## 2022-08-22 MED ORDER — DOCUSATE SODIUM 100 MG PO CAPS: 100.0000 mg | ORAL_CAPSULE | Freq: Two times a day (BID) | ORAL | 0 refills | Status: AC

## 2022-08-22 MED ORDER — TRAMADOL HCL 50 MG PO TABS: 50.0000 mg | ORAL_TABLET | Freq: Four times a day (QID) | ORAL | 0 refills | Status: AC | PRN

## 2022-08-22 MED ORDER — ONDANSETRON HCL 4 MG PO TABS: 4.0000 mg | ORAL_TABLET | Freq: Four times a day (QID) | ORAL | 0 refills | Status: AC | PRN

## 2022-08-22 MED ORDER — CELECOXIB 200 MG PO CAPS: 200.0000 mg | ORAL_CAPSULE | Freq: Two times a day (BID) | ORAL | 0 refills | Status: AC

## 2022-08-22 MED ORDER — ENOXAPARIN SODIUM 40 MG/0.4ML IJ SOSY: 40.0000 mg | PREFILLED_SYRINGE | INTRAMUSCULAR | 0 refills | Status: AC

## 2022-08-22 NOTE — Discharge Summary (Signed)
Physician Discharge Summary  Patient ID: Lindsey Woods MRN: [id number] DOB/AGE: 65-20-1957 65 y.o.  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 female who was admitted 08/21/2022 with a diagnosis of Osteoarthritis of left hip and went to the operating room on 08/21/2022 and underwent the above named procedures.    Surgeries: Procedure(s): TOTAL HIP ARTHROPLASTY ANTERIOR APPROACH on 08/21/2022 Patient tolerated the surgery well. Taken to PACU where she was stabilized and then transferred to the orthopedic floor.  Started on Lovenox 40 mg q 24 hrs.SCDs and TED hose applied. No evidence of DVT. Negative Homans.  2+ dorsalis pedis pulses present Physical therapy started on day #1 for gait training and transfer. OT started day #1 for ADL and assisted devices.  On postop day 1, patient's vitals and labs are stable and within normal limits.  Pain well controlled.  Patient was able to participate with physical therapy and completed all physical therapy goals safely.  PT recommended discharge home with home health PT. On post op day #1 patient was stable and ready for discharge to home with home health PT.  Implants: Cup 52 trident    Liner: MDM shell liner 28/48 inner  Stem insignia 3 high offset  Head: 28 ceramic biolox v40   She was given  perioperative antibiotics:  Anti-infectives (From admission, onward)    Start     Dose/Rate Route Frequency Ordered Stop   08/21/22 1330  ceFAZolin (ANCEF) IVPB 2g/100 mL premix        2 g 200 mL/hr over 30 Minutes Intravenous Every 6 hours 08/21/22 1120 08/21/22 2019   08/21/22 0618  ceFAZolin (ANCEF) 2-4 GM/100ML-% IVPB       Note to Pharmacy: Register, Clydie Braun A: cabinet override      08/21/22 0618 08/21/22 0759   08/21/22 0600  ceFAZolin (ANCEF) IVPB 2g/100 mL premix        2 g 200 mL/hr over 30 Minutes Intravenous On call to O.R. 08/20/22 2156 08/21/22 0802   08/20/22 2200  vancomycin (VANCOCIN) IVPB 1000 mg/200 mL premix        1,000 mg 200 mL/hr over 60 Minutes Intravenous  Once 08/20/22 2156 08/21/22 0755     .  She was given sequential compression devices, early ambulation, and Lovenox, teds for DVT prophylaxis.  She benefited maximally from the hospital stay and there were no complications.    Recent vital signs:  Vitals:   08/22/22 0501 08/22/22 0807  BP: 121/71 111/70  Pulse: 74 73  Resp: 20 16  Temp: 98.2 F (36.8 C) 97.6 F (36.4 C)  SpO2: 100% 100%    Recent laboratory studies:  Lab Results  Component Value Date   HGB 10.4 (L) 08/22/2022  HGB 10.8 (L) 08/14/2022   HGB 11.2 (L) 02/22/2022   Lab Results  Component Value Date   WBC 10.3 08/22/2022   PLT 230 08/22/2022   No results found for: "INR" Lab Results  Component Value Date   NA 139 08/22/2022   K 4.2 08/22/2022   CL 107 08/22/2022   CO2 26 08/22/2022   BUN 17 08/22/2022   CREATININE 0.77 08/22/2022   GLUCOSE 155 (H) 08/22/2022    Discharge Medications:   Allergies as of 08/22/2022   No Known Allergies      Medication List     STOP taking these medications    naproxen sodium 220 MG tablet Commonly known as: ALEVE       TAKE these medications    acetaminophen 500 MG tablet Commonly known as: TYLENOL Take 1,000 mg by mouth every 6 (six) hours as needed.   albuterol 108  (90 Base) MCG/ACT inhaler Commonly known as: VENTOLIN HFA Inhale 1-2 puffs into the lungs every 6 (six) hours as needed for wheezing or shortness of breath.   celecoxib 200 MG capsule Commonly known as: CeleBREX Take 1 capsule (200 mg total) by mouth 2 (two) times daily for 14 days.   cetirizine 10 MG tablet Commonly known as: ZYRTEC Take 10 mg by mouth daily as needed for allergies.   clobetasol cream 0.05 % Commonly known as: TEMOVATE Apply 1 Application topically as needed.   diphenhydrAMINE 25 mg capsule Commonly known as: BENADRYL Take 25 mg by mouth every 6 (six) hours as needed.   docusate sodium 100 MG capsule Commonly known as: COLACE Take 1 capsule (100 mg total) by mouth 2 (two) times daily.   DULoxetine 60 MG capsule Commonly known as: CYMBALTA Take 60 mg by mouth in the morning.   enoxaparin 40 MG/0.4ML injection Commonly known as: LOVENOX Inject 0.4 mLs (40 mg total) into the skin daily for 14 days.   EPINEPHrine 0.15 MG/0.15ML injection Commonly known as: ADRENACLICK Inject 4.12 mg into the muscle as needed for anaphylaxis.   gabapentin 300 MG capsule Commonly known as: NEURONTIN Take 600 mg by mouth at bedtime.   methocarbamol 500 MG tablet Commonly known as: ROBAXIN Take 1 tablet (500 mg total) by mouth every 6 (six) hours as needed for muscle spasms.   mupirocin ointment 2 % Commonly known as: BACTROBAN Apply small about inside of both nostrils TWICE a day for the next 5 days.   omeprazole 20 MG capsule Commonly known as: PRILOSEC Take 20 mg by mouth as needed.   ondansetron 4 MG tablet Commonly known as: ZOFRAN Take 1 tablet (4 mg total) by mouth every 6 (six) hours as needed for nausea.   polyethylene glycol 17 g packet Commonly known as: MIRALAX / GLYCOLAX Take 17 g by mouth daily as needed for mild constipation.   traMADol 50 MG tablet Commonly known as: ULTRAM Take 1 tablet (50 mg total) by mouth every 6 (six) hours as needed for  moderate pain. What changed: reasons to take this   VITAMIN B-12 PO Take 1,000 mcg by mouth in the morning.   VITAMIN D3 PO Take 1,000 Units by mouth in the morning.        Diagnostic Studies: DG HIP UNILAT WITH PELVIS 1V LEFT  Result Date: 08/21/2022 CLINICAL DATA:  Total left hip arthroplasty. EXAM: DG HIP (WITH OR WITHOUT PELVIS) 1V*L* COMPARISON:  None Available. FINDINGS: Images were performed intraoperatively without the presence of a radiologist. Total right hip arthroplasty is  seen prior to this procedure. Severe left hip osteoarthritis on the initial image. The patient is undergoing total left hip arthroplasty. No hardware complication is seen. Total fluoroscopy images: 9 Total fluoroscopy time: 20 seconds Total dose: Radiation Exposure Index (as provided by the fluoroscopic device): 3.483 mGy air Kerma Please see intraoperative findings for further detail. IMPRESSION: Intraoperative fluoroscopy for total left hip arthroplasty. Electronically Signed   By: Neita Garnet M.D.   On: 08/21/2022 11:32   DG C-Arm 1-60 Min-No Report  Result Date: 08/21/2022 Fluoroscopy was utilized by the requesting physician.  No radiographic interpretation.   DG C-Arm 1-60 Min-No Report  Result Date: 08/21/2022 Fluoroscopy was utilized by the requesting physician.  No radiographic interpretation.    Disposition:      Follow-up Information     Evon Slack, PA-C Follow up in 2 week(s).   Specialties: Orthopedic Surgery, Emergency Medicine Contact information: [address] [place] [address] [phone]                  Signed: Patience Musca 08/22/2022, 11:58 AM

## 2022-08-22 NOTE — Progress Notes (Signed)
Spoke with the patient She lives at home with her husband She has a 3 in 1 and a rolling walker as well as a cane and grab bars [facility] accepted the patient for Home health services Her husband to provide transportation

## 2022-08-22 NOTE — Care Management Obs Status (Signed)
[facility] NOTIFICATION   Patient Details  Name: Lindsey Woods MRN: [id number] Date of Birth: November 30, 1955   Medicare Observation Status Notification Given:  Yes    Conception Oms, RN 08/22/2022, 9:25 AM

## 2022-08-22 NOTE — Progress Notes (Addendum)
   Subjective: 1 Day Post-Op Procedure(s) (LRB): TOTAL HIP ARTHROPLASTY ANTERIOR APPROACH (Left) Patient reports pain as mild.   Patient is well, and has had no acute complaints or problems Denies any CP, SOB, ABD pain. We will continue therapy today.  Plan is to go Home after hospital stay.  Objective: Vital signs in last 24 hours: Temp:  [97 F (36.1 C)-98.5 F (36.9 C)] 98.2 F (36.8 C) (10/03 0501) Pulse Rate:  [62-76] 74 (10/03 0501) Resp:  [13-25] 20 (10/03 0501) BP: (118-129)/(61-73) 121/71 (10/03 0501) SpO2:  [98 %-100 %] 100 % (10/03 0501)  Intake/Output from previous day: 10/02 0701 - 10/03 0700 In: 1220 [P.O.:120; I.V.:500; IV Piggyback:600] Out: 200 [Blood:200] Intake/Output this shift: No intake/output data recorded.  Recent Labs    08/22/22 0536  HGB 10.4*   Recent Labs    08/22/22 0536  WBC 10.3  RBC 4.35  HCT 34.1*  PLT 230   Recent Labs    08/22/22 0536  NA 139  K 4.2  CL 107  CO2 26  BUN 17  CREATININE 0.77  GLUCOSE 155*  CALCIUM 8.7*   No results for input(s): "LABPT", "INR" in the last 72 hours.  EXAM General - Patient is Alert, Appropriate, and Oriented Extremity - Neurovascular intact Sensation intact distally Intact pulses distally Dorsiflexion/Plantar flexion intact No cellulitis present Compartment soft Dressing - dressing C/D/I and no drainage Motor Function - intact, moving foot and toes well on exam.   Past Medical History:  Diagnosis Date   Anemia    Arthritis    Asthma    well controlled   Colon polyps    Complication of anesthesia    woke up briefly during egd   Depression    GERD (gastroesophageal reflux disease)    Headache    h/o migraines   Obesity    Osteoarthritis of hip     Assessment/Plan:   1 Day Post-Op Procedure(s) (LRB): TOTAL HIP ARTHROPLASTY ANTERIOR APPROACH (Left) Principal Problem:   Osteoarthritis of left hip  Estimated body mass index is 31.62 kg/m as calculated from the  following:   Height as of this encounter: 5\' 5"  (1.651 m).   Weight as of this encounter: 86.2 kg. Advance diet Up with therapy Pain well controlled Vital signs are stable Labs are stable Care management to assist with discharge to home with home health PT today pending safe completion of PT goals  DVT Prophylaxis - Lovenox, TED hose, and SCDs Weight-Bearing as tolerated to left leg   T. Rachelle Hora, PA-C [place] 08/22/2022, 7:54 AM   Patient seen and examined, agree with above plan.  The patient is doing well status post left anterior total hip replacement no concerns at this time.  Pain is controlled.  Discussed DVT prophylaxis, pain medication use, and safe transition to home.  All questions answered the patient agrees with above plan will go home after clears PT.   Steffanie Rainwater MD

## 2022-08-22 NOTE — Progress Notes (Signed)
Physical Therapy Treatment Patient Details Name: Lindsey Woods MRN: [id number] DOB: [birth year] Today's Date: 08/22/2022   History of Present Illness Pt is a 66 yo female s/p L THA. PMH of asthma, GERD, R THA.    PT Comments    Patient alert, up in room with CNA upon PT entrance. Pt's personal RW adjusted, pt able to statically stand without UE support. She ambulated ~282ft with RW CGA-supervision. Did endorse feeling a little whoozy (recent pain medication, awaiting for breakfast). Did display varying gait; intermittent step to, step through, antalgic. decreased weight bearing in LLE. Also performed stair navigation safely, verbalized proper technique. The patient would benefit from further skilled PT intervention to continue to progress towards goals. Recommendation remains appropriate.     Recommendations for follow up therapy are one component of a multi-disciplinary discharge planning process, led by the attending physician.  Recommendations may be updated based on patient status, additional functional criteria and insurance authorization.  Follow Up Recommendations  Follow physician's recommendations for discharge plan and follow up therapies     Assistance Recommended at Discharge Intermittent Supervision/Assistance  Patient can return home with the following Assistance with cooking/housework;Assist for transportation;Help with stairs or ramp for entrance   Equipment Recommendations  None recommended by PT    Recommendations for Other Services       Precautions / Restrictions Precautions Precautions: Anterior Hip;Fall Precaution Booklet Issued: No Restrictions Weight Bearing Restrictions: Yes LLE Weight Bearing: Weight bearing as tolerated     Mobility  Bed Mobility               General bed mobility comments: standing in room with CNA at start of session    Transfers Overall transfer level: Needs assistance Equipment used: Rolling walker (2 wheels)   Sit  to Stand: Supervision                Ambulation/Gait Ambulation/Gait assistance: Supervision, Min guard Gait Distance (Feet): 220 Feet Assistive device: Rolling walker (2 wheels)         General Gait Details: varying gait; intermittent step to, step through, antalgic. decreased weight bearing in LLE.   Stairs Stairs: Yes Stairs assistance: Min guard Stair Management: One rail Left, Step to pattern, Sideways Number of Stairs: 4 General stair comments: no LOB, able to  verbalize safe technique   Wheelchair Mobility    Modified Rankin (Stroke Patients Only)       Balance Overall balance assessment: Needs assistance Sitting-balance support: Feet supported Sitting balance-Leahy Scale: Good     Standing balance support: Single extremity supported Standing balance-Leahy Scale: Good                              Cognition Arousal/Alertness: Awake/alert Behavior During Therapy: WFL for tasks assessed/performed Overall Cognitive Status: Within Functional Limits for tasks assessed                                          Exercises      General Comments        Pertinent Vitals/Pain Pain Assessment Pain Assessment: No/denies pain    Home Living                          Prior Function            PT  Goals (current goals can now be found in the care plan section) Progress towards PT goals: Progressing toward goals    Frequency    BID      PT Plan Current plan remains appropriate    Co-evaluation              AM-PAC PT "6 Clicks" Mobility   Outcome Measure  Help needed turning from your back to your side while in a flat bed without using bedrails?: None Help needed moving from lying on your back to sitting on the side of a flat bed without using bedrails?: None Help needed moving to and from a bed to a chair (including a wheelchair)?: None Help needed standing up from a chair using your arms (e.g.,  wheelchair or bedside chair)?: None Help needed to walk in hospital room?: A Little Help needed climbing 3-5 steps with a railing? : A Little 6 Click Score: 22    End of Session Equipment Utilized During Treatment: Gait belt Activity Tolerance: Patient tolerated treatment well Patient left: with chair alarm set;in chair;with call bell/phone within reach Nurse Communication: Mobility status PT Visit Diagnosis: Other abnormalities of gait and mobility (R26.89);Difficulty in walking, not elsewhere classified (R26.2);Muscle weakness (generalized) (M62.81);Pain Pain - Right/Left: Left Pain - part of body: Hip     Time: [id number] PT Time Calculation (min) (ACUTE ONLY): 15 min  Charges:  $Therapeutic Exercise: 8-22 mins                     Lieutenant Diego PT, DPT 9:25 AM,08/22/22

## 2022-08-22 NOTE — Progress Notes (Signed)
Discharge instructions reviewed with pt. She verbalized understanding of instructions. Extra honeycomb dressing sent with pt

## 2022-08-22 NOTE — Discharge Instructions (Signed)
Instructions after Anterior Total Hip Replacement        Dr. Serita Butcher., M.D.      Dept. of [facility]  [place], [place]  [address]  Phone: [phone]   Fax: [phone]    DIET: Drink plenty of non-alcoholic fluids. Resume your normal diet. Include foods high in fiber.  ACTIVITY:  You may use crutches or a walker with weight-bearing as tolerated, unless instructed otherwise. You may be weaned off of the walker or crutches by your Physical Therapist.  Continue doing gentle exercises. Exercising will reduce the pain and swelling, increase motion, and prevent muscle weakness.   Please continue to use the TED compression stockings for 6 weeks. You may remove the stockings at night, but should reapply them in the morning. Do not drive or operate any equipment until instructed.  WOUND CARE:  Continue to use ice packs periodically to reduce pain and swelling. You may shower with honeycomb dressing 3 days after your surgery. Do not submerge incision site under water. Remove honeycomb dressing 7 days after surgery and allow dermabond to fall off on its own.   MEDICATIONS: You may resume your regular medications. Please take the pain medication as prescribed on the medication list. Do not take pain medication on an empty stomach. You have been given a prescription for a blood thinner to prevent blood clots. Please take the medication as instructed. (NOTE: After completing a 2 week course of Lovenox, take one Enteric-coated 81 mg aspirin twice a day.) Pain medications and iron supplements can cause constipation. Use a stool softener (Senokot or Colace) on a daily basis and a laxative (dulcolax or miralax) as needed. Do not drive or drink alcoholic beverages when taking pain medications.  CALL THE OFFICE FOR: Temperature above 101 degrees Excessive bleeding or drainage on the dressing. Excessive swelling, coldness, or  paleness of the toes. Persistent nausea and vomiting.  FOLLOW-UP:  You should have an appointment to return to the office in 2 weeks after surgery. Arrangements have been made for continuation of Physical Therapy (either home therapy or outpatient therapy).

## 2022-08-23 LAB — SURGICAL PATHOLOGY

## 2022-10-02 ENCOUNTER — Encounter: Payer: Self-pay | Admitting: Orthopedic Surgery

## 2023-11-07 IMAGING — RF DG HIP (WITH OR WITHOUT PELVIS) 1V*R*
1 series · 3 of 3 positions shown · non-contrast
Comparison: None

Dose: 1.3326 mGy cumulative air kerma

CLINICAL DATA: RIGHT total hip replacement

EXAM:
DG HIP (WITH OR WITHOUT PELVIS) 1V RIGHT

[Series 1: dg x-ray · 0.20mm/px · 3 of 3 slices shown]
[im 1/3]
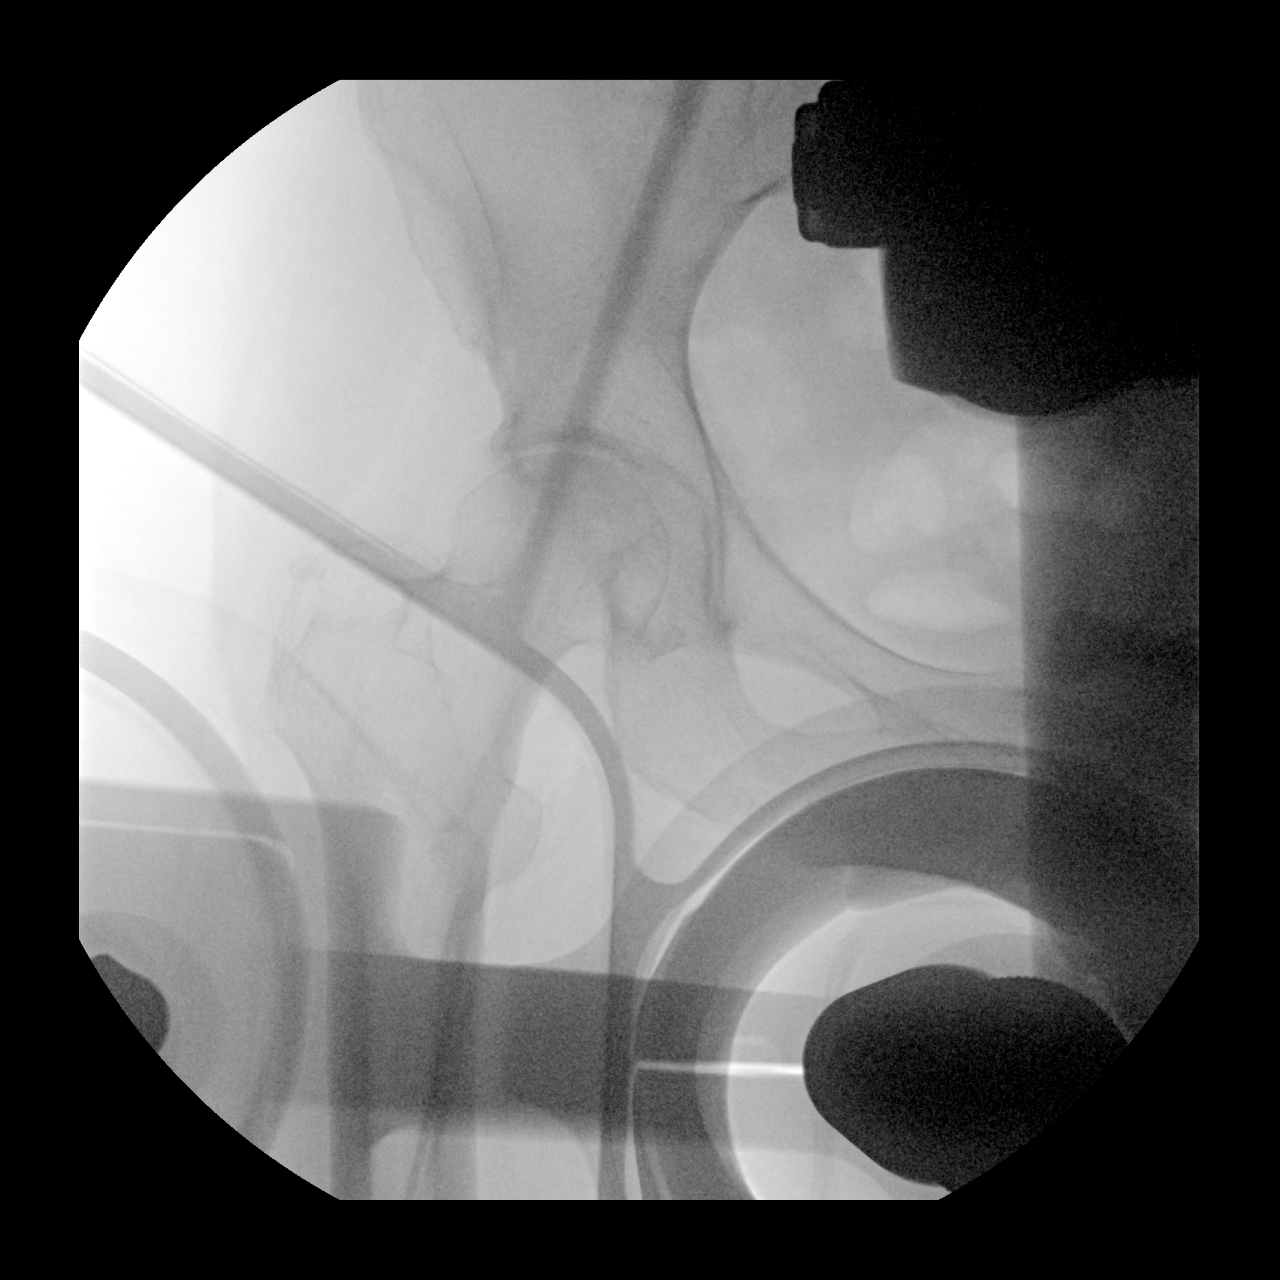
[im 2/3]
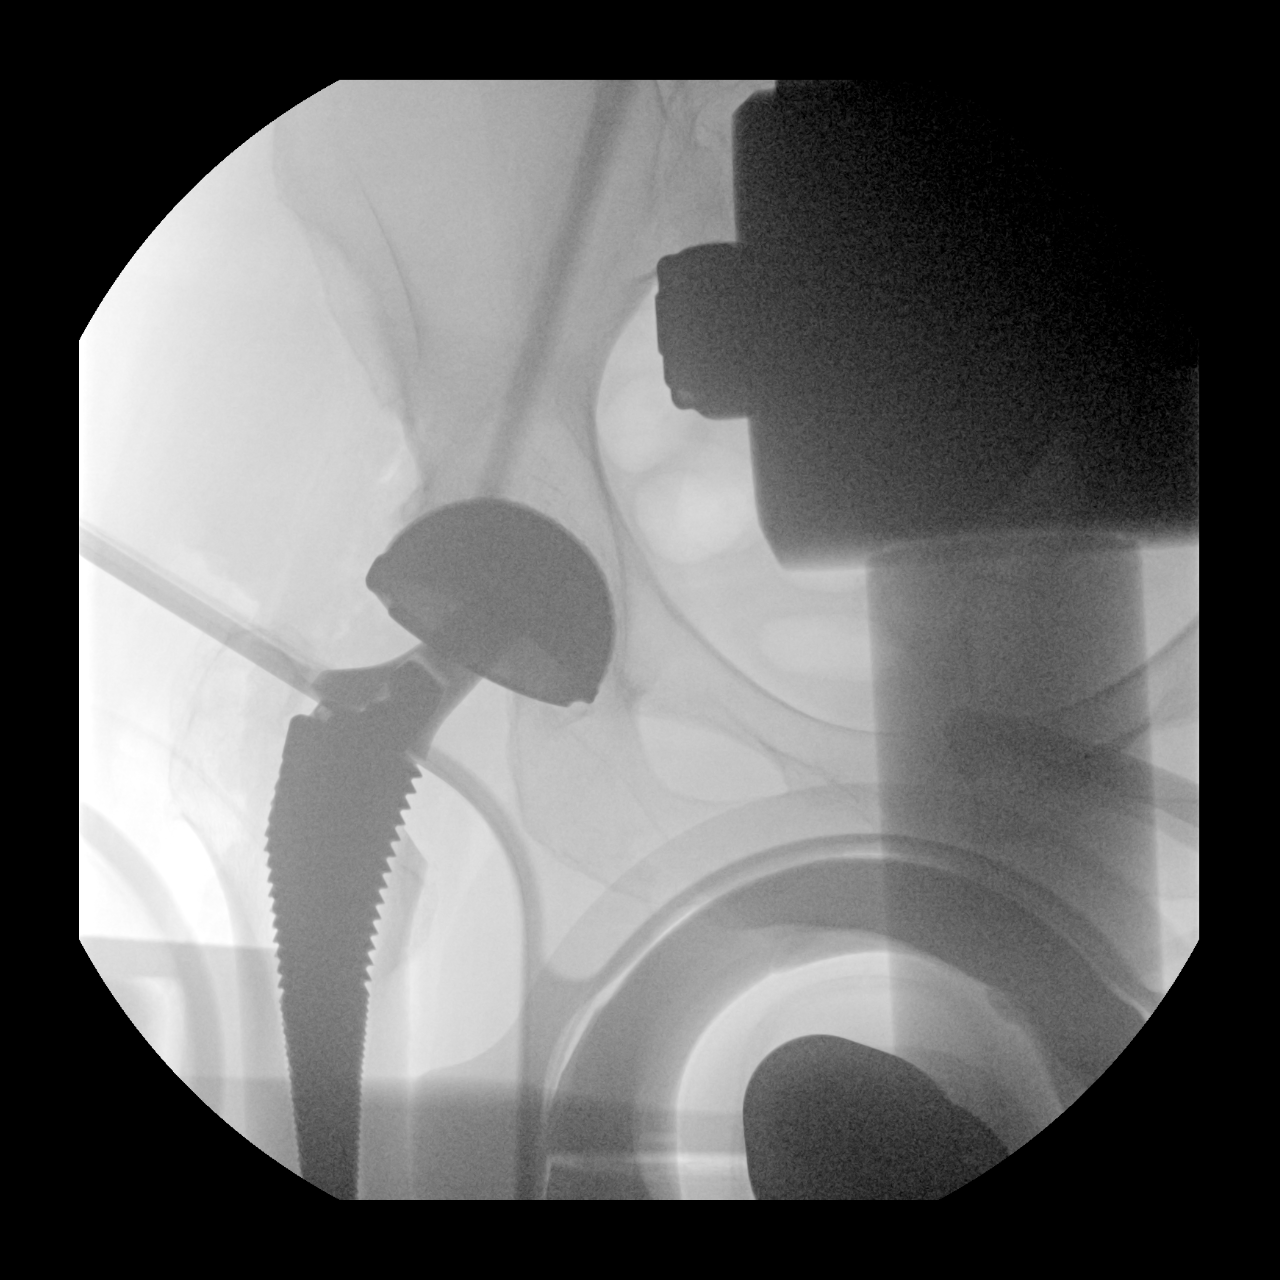
[im 3/3]
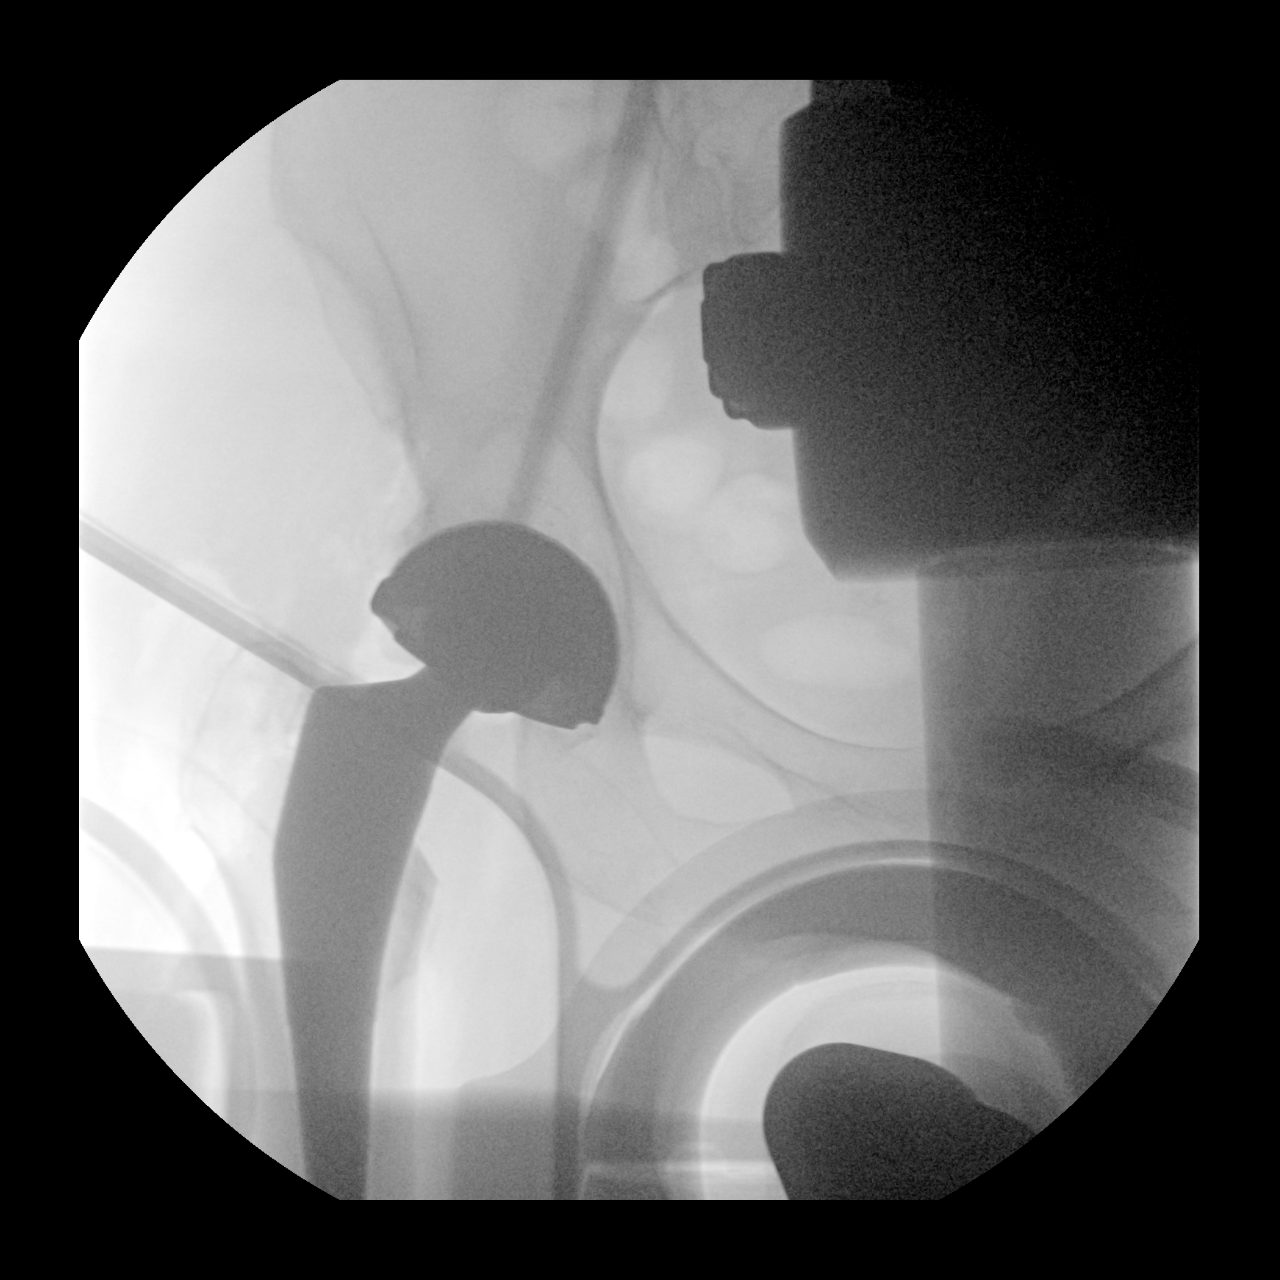

[3 of 3 positions shown; findings below may reference images not displayed]

FINDINGS: Three images obtained.

Images demonstrate resection of the RIGHT femoral head/neck and
placement of a RIGHT hip prosthesis.

No fracture, dislocation or bone destruction identified on limited
AP imaging.
IMPRESSION: RIGHT hip prosthesis without acute complication.
